# Patient Record
Sex: Male | Born: 1980 | Race: White | Hispanic: No | Marital: Single
Health system: Midwestern US, Community
[De-identification: ages and names within clinical notes are randomized; demographics above are authoritative.]

## PROBLEM LIST (undated history)

## (undated) DIAGNOSIS — J45909 Unspecified asthma, uncomplicated: Secondary | ICD-10-CM

## (undated) DIAGNOSIS — F4310 Post-traumatic stress disorder, unspecified: Secondary | ICD-10-CM

## (undated) DIAGNOSIS — F419 Anxiety disorder, unspecified: Secondary | ICD-10-CM

## (undated) HISTORY — PX: TONSILLECTOMY: SUR1361

## (undated) HISTORY — DX: Post-traumatic stress disorder, unspecified: F43.10

## (undated) HISTORY — PX: WISDOM TOOTH EXTRACTION: SHX21

## (undated) HISTORY — PX: COLONOSCOPY: SHX174

## (undated) MED ORDER — OMEPRAZOLE 20 MG CAP, DELAYED RELEASE
20 mg | ORAL_CAPSULE | Freq: Every day | ORAL | Status: AC
Start: ? — End: 2013-03-15

---

## 2009-07-26 ENCOUNTER — Emergency Department (HOSPITAL_COMMUNITY): Admission: EM | Admit: 2009-07-26 | Discharge: 2009-07-26 | Payer: Self-pay | Admitting: Emergency Medicine

## 2009-07-26 ENCOUNTER — Emergency Department (HOSPITAL_COMMUNITY): Admission: EM | Admit: 2009-07-26 | Discharge: 2009-07-27 | Payer: Self-pay | Admitting: Emergency Medicine

## 2010-11-08 NOTE — ED Provider Notes (Addendum)
HPI Comments: This is a 29 y.o.male who presents to the ED secondary to chest pain (5/10).  Pt reports intermittent chest and upper abdominal pain for the past 3 days that is not pleuritic and is not exacerbated by movement.  Pt reports he feels dizzy when the pain comes and reports each episode lasts for 30 minutes to one hour.  Pt states, "moving around makes it better."  Pt reports he is not currently in pain, but was upon arrival to ED.  Pt denies SOB, injury, recent surgery, does not verbalize complaints of n/v/d, urinary symptoms, syncope, fever, or any other medical complaint at this time.  Social hx:  Pt reports he quit smoking tobacco 4 months ago; Pt reports occasional alcohol use.    Note written by Leotis Shames A. Alben Spittle, Scribe, as dictated by Raynald Kemp, MD 8:15 PM          The history is provided by the patient.        No past medical history on file.     No past surgical history on file.      No family history on file.     History   Social History   ??? Marital Status: N/A     Spouse Name: N/A     Number of Children: N/A   ??? Years of Education: N/A   Occupational History   ??? Not on file.   Social History Main Topics   ??? Smoking status: Not on file   ??? Smokeless tobacco: Not on file   ??? Alcohol Use: Yes      occassional   ??? Drug Use: No   ??? Sexually Active:    Other Topics Concern   ??? Not on file   Social History Narrative   ??? No narrative on file                    ALLERGIES: Review of patient's allergies indicates no known allergies.      Review of Systems   Constitutional: Negative for fever and chills.   HENT: Negative for congestion and rhinorrhea.    Respiratory: Negative for cough, shortness of breath, wheezing and stridor.    Cardiovascular: Positive for chest pain. Negative for palpitations and leg swelling.   Gastrointestinal: Positive for abdominal pain (epigastric). Negative for nausea, vomiting, diarrhea and abdominal distention.    Genitourinary: Negative for dysuria, urgency, frequency, hematuria, flank pain, decreased urine volume and difficulty urinating.   Musculoskeletal: Negative for myalgias and arthralgias.   Skin: Negative for color change, pallor, rash and wound.   Neurological: Positive for dizziness.   All other systems reviewed and are negative.    Note written by Leotis Shames A. Alben Spittle, Scribe, as dictated by Raynald Kemp, MD 8:18 PM        Filed Vitals:    11/08/10 1917   BP: 134/78   Pulse: 80   Temp: 98 ??F (36.7 ??C)   Resp: 18   Height: 6' (1.829 m)   Weight: 180 lb (81.647 kg)   SpO2: 100%              Physical Exam   Nursing note and vitals reviewed.  Constitutional: He is oriented to person, place, and time. He appears well-developed and well-nourished. No distress.   HENT:   Head: Normocephalic and atraumatic.   Eyes: Pupils are equal, round, and reactive to light.   Neck: Neck supple. No JVD present. No tracheal deviation present.   Cardiovascular:  Normal rate, regular rhythm and normal heart sounds.  Exam reveals no gallop and no friction rub.    No murmur heard.  Pulmonary/Chest: Effort normal and breath sounds normal. No stridor. No respiratory distress. He has no wheezes. He has no rales. He exhibits no tenderness.   Abdominal: Soft. He exhibits no distension and no mass. No tenderness. He has no rebound and no guarding.   Musculoskeletal: Normal range of motion. He exhibits no edema and no tenderness.   Neurological: He is alert and oriented to person, place, and time.   Skin: Skin is warm and dry. No rash noted. He is not diaphoretic. No erythema. No pallor.   Psychiatric: He has a normal mood and affect. His behavior is normal. Judgment and thought content normal.    Note written by Leotis Shames A. Alben Spittle, Scribe, as dictated by Raynald Kemp, MD 8:19 PM        MDM    Procedures    ED EKG interpretation:   Rhythm: normal sinus rhythm; and regular . Rate (approx.): 75; Axis: normal; P wave: normal; QRS interval: prolonged; ST/T wave: normal; in  Lead: ; Other findings: . This EKG was interpreted by Raynald Kemp, MD,ED Provider. 9:33 PM

## 2010-11-08 NOTE — ED Notes (Signed)
Patient complains of chest tightness and dizziness for 3 days.

## 2010-11-08 NOTE — ED Notes (Signed)
Verbal report given to Kay,RN who will assume care of patient at this time.

## 2010-11-09 LAB — EKG, 12 LEAD, INITIAL
Atrial Rate: 76 {beats}/min
Calculated P Axis: 63 degrees
Calculated R Axis: 45 degrees
Calculated T Axis: 37 degrees
Diagnosis: NORMAL
P-R Interval: 174 ms
Q-T Interval: 392 ms
QRS Duration: 110 ms
QTC Calculation (Bezet): 441 ms
Ventricular Rate: 76 {beats}/min

## 2010-11-09 LAB — CBC WITH AUTOMATED DIFF
ABS. BASOPHILS: 0.1 10*3/uL (ref 0.0–0.1)
ABS. EOSINOPHILS: 0.2 10*3/uL (ref 0.0–0.4)
ABS. LYMPHOCYTES: 2.8 10*3/uL (ref 0.8–3.5)
ABS. MONOCYTES: 0.8 10*3/uL (ref 0.0–1.0)
ABS. NEUTROPHILS: 3 10*3/uL (ref 1.8–8.0)
BASOPHILS: 1 % (ref 0–1)
EOSINOPHILS: 3 % (ref 0–7)
HCT: 40.4 % (ref 36.6–50.3)
HGB: 13.6 g/dL (ref 12.1–17.0)
LYMPHOCYTES: 41 % (ref 12–49)
MCH: 29.5 PG (ref 26.0–34.0)
MCHC: 33.7 g/dL (ref 30.0–36.5)
MCV: 87.6 FL (ref 80.0–99.0)
MONOCYTES: 11 % (ref 5–13)
NEUTROPHILS: 44 % (ref 32–75)
PLATELET: 358 10*3/uL (ref 150–400)
RBC: 4.61 M/uL (ref 4.10–5.70)
RDW: 12.5 % (ref 11.5–14.5)
WBC: 6.8 10*3/uL (ref 4.1–11.1)

## 2010-11-09 LAB — METABOLIC PANEL, COMPREHENSIVE
A-G Ratio: 1.3 (ref 1.1–2.2)
ALT (SGPT): 38 U/L (ref 12–78)
AST (SGOT): 27 U/L (ref 15–37)
Albumin: 4.1 g/dL (ref 3.5–5.0)
Alk. phosphatase: 71 U/L (ref 50–136)
Anion gap: 10 mmol/L (ref 5–15)
BUN/Creatinine ratio: 10 — ABNORMAL LOW (ref 12–20)
BUN: 10 MG/DL (ref 6–20)
Bilirubin, total: 0.3 MG/DL (ref 0.2–1.0)
CO2: 27 MMOL/L (ref 21–32)
Calcium: 8.8 MG/DL (ref 8.5–10.1)
Chloride: 106 MMOL/L (ref 97–108)
Creatinine: 1 MG/DL (ref 0.6–1.3)
GFR est AA: >60 mL/min/{1.73_m2} (ref >60)
GFR est non-AA: >60 mL/min/{1.73_m2} (ref >60)
Globulin: 3.1 g/dL (ref 2.0–4.0)
Glucose: 85 MG/DL (ref 65–100)
Potassium: 3.5 MMOL/L (ref 3.5–5.1)
Protein, total: 7.2 g/dL (ref 6.4–8.2)
Sodium: 143 MMOL/L (ref 136–145)

## 2010-11-09 LAB — TROPONIN I: Troponin-I, Qt.: <0.04 ng/mL (ref <0.05)

## 2010-11-09 MED ORDER — RANITIDINE 150 MG TAB
150 mg | ORAL_TABLET | Freq: Two times a day (BID) | ORAL | Status: AC
Start: 2010-11-09 — End: 2010-11-18

## 2011-03-18 LAB — URINALYSIS, ROUTINE W REFLEX MICROSCOPIC
Glucose, UA: NEGATIVE mg/dL
Glucose, UA: NEGATIVE mg/dL
Ketones, ur: NEGATIVE mg/dL
Nitrite: NEGATIVE
Protein, ur: NEGATIVE mg/dL
Urobilinogen, UA: 0.2 mg/dL (ref 0.0–1.0)
pH: 7 (ref 5.0–8.0)

## 2011-03-18 LAB — BASIC METABOLIC PANEL
BUN: 5 mg/dL — ABNORMAL LOW (ref 6–23)
Calcium: 9.2 mg/dL (ref 8.4–10.5)
Creatinine, Ser: 0.89 mg/dL (ref 0.4–1.5)
GFR calc Af Amer: >60 mL/min (ref >60)
GFR calc non Af Amer: >60 mL/min (ref >60)

## 2011-03-18 LAB — CBC
MCHC: 33.4 g/dL (ref 30.0–36.0)
MCV: 93.1 fL (ref 78.0–100.0)
RBC: 4.96 MIL/uL (ref 4.22–5.81)
RDW: 13.7 % (ref 11.5–15.5)

## 2011-03-18 LAB — GLUCOSE, CAPILLARY: Glucose-Capillary: 90 mg/dL (ref 70–99)

## 2011-03-18 LAB — COMPREHENSIVE METABOLIC PANEL
AST: 22 U/L (ref 0–37)
CO2: 30 mEq/L (ref 19–32)
Calcium: 9 mg/dL (ref 8.4–10.5)
Creatinine, Ser: 0.96 mg/dL (ref 0.4–1.5)
GFR calc Af Amer: >60 mL/min (ref >60)
GFR calc non Af Amer: >60 mL/min (ref >60)
Total Protein: 6.2 g/dL (ref 6.0–8.3)

## 2011-03-18 LAB — RAPID URINE DRUG SCREEN, HOSP PERFORMED
Barbiturates: NOT DETECTED
Benzodiazepines: NOT DETECTED
Cocaine: NOT DETECTED

## 2011-03-18 LAB — DIFFERENTIAL
Eosinophils Relative: 2 % (ref 0–5)
Lymphocytes Relative: 23 % (ref 12–46)
Lymphs Abs: 2.4 10*3/uL (ref 0.7–4.0)
Monocytes Relative: 10 % (ref 3–12)
Neutrophils Relative %: 65 % (ref 43–77)

## 2011-03-18 LAB — D-DIMER, QUANTITATIVE: D-Dimer, Quant: <0.22 ug{FEU}/mL (ref 0.00–0.48)

## 2012-02-27 NOTE — ED Provider Notes (Signed)
Patient is a 31 y.o. male presenting with chest pain and palpitations. The history is provided by the patient.   Chest Pain   This is a new problem. The current episode started yesterday. The problem has been resolved. The pain is associated with normal activity. The pain is present in the substernal region. The patient is experiencing no pain. The quality of the pain is described as pressure-like. The pain does not radiate. Associated symptoms include palpitations. Pertinent negatives include no abdominal pain, no cough, no diaphoresis, no fever, no malaise/fatigue, no nausea, no shortness of breath and no vomiting. He has tried nothing for the symptoms. Risk factors include smoking/tobacco exposure.   Palpitations   Associated symptoms include chest pain. Pertinent negatives include no diaphoresis, no fever, no malaise/fatigue, no abdominal pain, no nausea, no vomiting, no cough and no shortness of breath.    31 yo tobacco user who presents with intermittent episodes of chest tightness. No pain currently. Denies SOB.     No past medical history on file.     No past surgical history on file.      No family history on file.     History     Social History   ??? Marital Status: SINGLE     Spouse Name: N/A     Number of Children: N/A   ??? Years of Education: N/A     Occupational History   ??? Not on file.     Social History Main Topics   ??? Smoking status: Not on file   ??? Smokeless tobacco: Not on file   ??? Alcohol Use: Yes      occassional   ??? Drug Use: No   ??? Sexually Active:      Other Topics Concern   ??? Not on file     Social History Narrative   ??? No narrative on file                  ALLERGIES: Review of patient's allergies indicates no known allergies.      Review of Systems   Constitutional: Negative for fever, malaise/fatigue and diaphoresis.   Respiratory: Negative for cough and shortness of breath.    Cardiovascular: Positive for chest pain and palpitations.   Gastrointestinal: Negative for nausea, vomiting,  abdominal pain and diarrhea.   All other systems reviewed and are negative.        Filed Vitals:    02/27/12 1914   BP: 127/82   Pulse: 88   Temp: 98.3 ??F (36.8 ??C)   Resp: 20   Height: 6' (1.829 m)   Weight: 81.647 kg (180 lb)   SpO2: 100%            Physical Exam   Nursing note and vitals reviewed.  Constitutional: He is oriented to person, place, and time. He appears well-developed and well-nourished. No distress.   HENT:   Head: Normocephalic and atraumatic.   Right Ear: External ear normal.   Left Ear: External ear normal.   Eyes: Conjunctivae and EOM are normal. Pupils are equal, round, and reactive to light. Right eye exhibits no discharge. Left eye exhibits no discharge. No scleral icterus.   Neck: Normal range of motion. Neck supple. No tracheal deviation present.   Cardiovascular: Normal rate, regular rhythm, normal heart sounds and intact distal pulses.    No murmur heard.  Pulmonary/Chest: Effort normal and breath sounds normal. No respiratory distress. He has no wheezes. He has no rales.  Abdominal: He exhibits no distension.   Musculoskeletal: Normal range of motion. He exhibits no edema and no tenderness.   Neurological: He is alert and oriented to person, place, and time. No cranial nerve deficit. Coordination normal.   Skin: Skin is warm. No rash noted. He is not diaphoretic. No erythema.   Psychiatric: He has a normal mood and affect. His behavior is normal. Judgment and thought content normal.        MDM    Procedures    ecg - NSR, rate 74, no ST elev or dep

## 2012-02-27 NOTE — ED Notes (Signed)
Pt told of results will treat for reflux and have pt f/u

## 2012-02-27 NOTE — ED Notes (Signed)
Chst pain and tightness intermittently throughout day.  Pain described as "squeezing" and "pressure".  No associated SOB.

## 2012-02-27 NOTE — ED Notes (Signed)
I have reviewed discharge instructions with the patient.  The patient verbalized understanding.

## 2012-02-28 LAB — CBC WITH AUTOMATED DIFF
ABS. BASOPHILS: 0 10*3/uL (ref 0.0–0.1)
ABS. EOSINOPHILS: 0.1 10*3/uL (ref 0.0–0.4)
ABS. LYMPHOCYTES: 3 10*3/uL (ref 0.8–3.5)
ABS. MONOCYTES: 0.6 10*3/uL (ref 0.0–1.0)
ABS. NEUTROPHILS: 3.1 10*3/uL (ref 1.8–8.0)
BASOPHILS: 1 % (ref 0–1)
EOSINOPHILS: 2 % (ref 0–7)
HCT: 41.9 % (ref 36.6–50.3)
HGB: 14.4 g/dL (ref 12.1–17.0)
LYMPHOCYTES: 44 % (ref 12–49)
MCH: 30.4 PG (ref 26.0–34.0)
MCHC: 34.4 g/dL (ref 30.0–36.5)
MCV: 88.6 FL (ref 80.0–99.0)
MONOCYTES: 9 % (ref 5–13)
NEUTROPHILS: 44 % (ref 32–75)
PLATELET: 315 10*3/uL (ref 150–400)
RBC: 4.73 M/uL (ref 4.10–5.70)
RDW: 12.4 % (ref 11.5–14.5)
WBC: 6.8 10*3/uL (ref 4.1–11.1)

## 2012-02-28 LAB — POC CHEM8
Anion gap (POC): 15 mmol/L (ref 5–15)
BUN (POC): 12 MG/DL (ref 9–20)
CO2 (POC): 25 MMOL/L (ref 21–32)
Calcium, ionized (POC): 1.14 MMOL/L (ref 1.12–1.32)
Chloride (POC): 103 MMOL/L (ref 98–107)
Creatinine (POC): 0.8 MG/DL (ref 0.6–1.3)
GFRAA, POC: >60 mL/min/{1.73_m2} (ref >60)
GFRNA, POC: >60 mL/min/{1.73_m2} (ref >60)
Glucose (POC): 114 MG/DL — ABNORMAL HIGH (ref 75–110)
Hematocrit (POC): 43 % (ref 36.6–50.3)
Hemoglobin (POC): 14.6 GM/DL (ref 12.1–17.0)
Potassium (POC): 3.2 MMOL/L — ABNORMAL LOW (ref 3.5–5.1)
Sodium (POC): 139 MMOL/L (ref 136–145)

## 2012-02-28 LAB — POC TROPONIN-I
Troponin-I (POC): <0.04 ng/mL (ref 0.00–0.08)
Troponin-I (POC): <0.04 ng/mL (ref 0.00–0.08)

## 2012-02-28 MED ORDER — OMEPRAZOLE 40 MG CAP, DELAYED RELEASE
40 mg | ORAL_CAPSULE | Freq: Every day | ORAL | Status: DC
Start: 2012-02-28 — End: 2014-07-02

## 2012-02-29 LAB — EKG, 12 LEAD, INITIAL
Atrial Rate: 74 {beats}/min
Calculated P Axis: 49 degrees
Calculated R Axis: 24 degrees
Calculated T Axis: 32 degrees
Diagnosis: NORMAL
P-R Interval: 190 ms
Q-T Interval: 374 ms
QRS Duration: 108 ms
QTC Calculation (Bezet): 415 ms
Ventricular Rate: 74 {beats}/min

## 2013-02-13 LAB — POC CHEM8
Anion gap (POC): 15 mmol/L (ref 5–15)
BUN (POC): 15 MG/DL (ref 9–20)
CO2 (POC): 27 MMOL/L (ref 21–32)
Calcium, ionized (POC): 1.12 MMOL/L (ref 1.12–1.32)
Chloride (POC): 100 MMOL/L (ref 98–107)
Creatinine (POC): 1 MG/DL (ref 0.6–1.3)
GFRAA, POC: >60 mL/min/{1.73_m2} (ref >60)
GFRNA, POC: >60 mL/min/{1.73_m2} (ref >60)
Glucose (POC): 95 MG/DL (ref 75–110)
Hematocrit (POC): 43 % (ref 36.6–50.3)
Hemoglobin (POC): 14.6 GM/DL (ref 12.1–17.0)
Potassium (POC): 3.4 MMOL/L — ABNORMAL LOW (ref 3.5–5.1)
Sodium (POC): 137 MMOL/L (ref 136–145)

## 2013-02-13 LAB — POC TROPONIN-I: Troponin-I (POC): <0.04 ng/mL (ref 0.00–0.08)

## 2013-02-13 MED ADMIN — pantoprazole (PROTONIX) injection 40 mg: INTRAVENOUS | @ 18:00:00 | NDC 00008400101

## 2013-02-13 MED ADMIN — mylanta/donnatal/viscous lidocaine (GI COCKTAIL): ORAL | @ 18:00:00 | NDC 99990238820

## 2013-02-13 NOTE — ED Notes (Signed)
Pt arrived via EMS for CP x 1 week. Pt states that the chest pain started when he began Prednisone for poison oak last week. Denies cardiac Hx. Pt denies any CP or complaints on arrival to this facility. Pt states that CP resolved en route.

## 2013-02-13 NOTE — ED Provider Notes (Signed)
Patient is a 32 y.o. male presenting with chest pain. The history is provided by the patient.   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 The current episode started more than 1 week ago. The problem has been gradually worsening. The problem occurs daily. Associated with: lying lfat. The pain is present in the substernal region. The pain is moderate. The quality of the pain is described as burning and pressure-like. The pain does not radiate. The symptoms are aggravated by certain positions. Pertinent negatives include no abdominal pain, no back pain, no claudication, no cough, no diaphoresis, no dizziness, no exertional chest pressure, no fever, no headaches, no hemoptysis, no irregular heartbeat, no leg pain, no lower extremity edema, no malaise/fatigue, no nausea, no near-syncope, no numbness, no orthopnea, no palpitations, no PND, no shortness of breath, no sputum production, no vomiting and no weakness. He has tried antacids for the symptoms. The treatment provided mild relief. Risk factors include smoking/tobacco exposure. His past medical history does not include aneurysm, cancer, DM, DVT, HTN, PE or CHF. Pertinent negatives include no exercise treadmill test.       No past medical history on file.     Past Surgical History   Procedure Laterality Date   ??? Hx heent       tonsilectomy         No family history on file.     History     Social History   ??? Marital Status: SINGLE     Spouse Name: N/A     Number of Children: N/A   ??? Years of Education: N/A     Occupational History   ??? Not on file.     Social History Main Topics   ??? Smoking status: Current Every Day Smoker -- 0.50 packs/day   ??? Smokeless tobacco: Not on file   ??? Alcohol Use: Yes      Comment: occassional   ??? Drug Use: No   ??? Sexually Active: Not on file      Other Topics Concern   ??? Not on file     Social History Narrative   ??? No narrative on file                  ALLERGIES: Review of patient's allergies indicates no known allergies.      Review of Systems   Constitutional: Negative for fever, malaise/fatigue and diaphoresis.   HENT: Positive for sore throat.    Respiratory: Negative for cough, hemoptysis, sputum production and shortness of breath.    Cardiovascular: Positive for chest pain. Negative for palpitations, orthopnea, claudication, PND and near-syncope.   Gastrointestinal: Negative for nausea, vomiting, abdominal pain, diarrhea, constipation and blood in stool.   Musculoskeletal: Negative for back pain and arthralgias.   Neurological: Negative for dizziness, syncope, weakness, light-headedness, numbness and headaches.   Hematological: Negative.        Filed Vitals:    02/13/13 1239   BP: 138/89   Pulse: 74   Temp: 97.7 ??F (36.5 ??C)   Resp: 18   Height: 6' (1.829 m)   Weight: 77.111 kg (170 lb)   SpO2: 99%            Physical Exam   Nursing note and vitals reviewed.  Constitutional: He is oriented  to person, place, and time. He appears well-developed and well-nourished. No distress.   HENT:   Head: Normocephalic and atraumatic.   Right Ear: External ear normal.   Left Ear: External ear normal.   Mouth/Throat: Oropharynx is clear and moist. No oropharyngeal exudate.   Eyes: Conjunctivae and EOM are normal. Pupils are equal, round, and reactive to light. Right eye exhibits no discharge. Left eye exhibits no discharge. No scleral icterus.   Neck: Normal range of motion. Neck supple. No tracheal deviation present. No thyromegaly present.   Cardiovascular: Normal rate, regular rhythm, normal heart sounds and intact distal pulses.    No murmur heard.  Pulmonary/Chest: Effort normal and breath sounds normal. No respiratory distress. He has no wheezes. He has no rales. He exhibits no tenderness.   Abdominal: Soft. Bowel sounds are normal. He exhibits no distension.  There is no tenderness. There is no rebound and no guarding.   Musculoskeletal: Normal range of motion. He exhibits no edema and no tenderness.   Lymphadenopathy:     He has no cervical adenopathy.   Neurological: He is alert and oriented to person, place, and time. No cranial nerve deficit. Coordination normal.   Skin: Skin is warm. No rash noted. He is not diaphoretic. No erythema. No pallor.   Psychiatric: He has a normal mood and affect. His behavior is normal. Judgment and thought content normal.        MDM    Procedures    ECG:  Rate 67, sinus rhythm, no ectopy, normal intervals and axis, no STTW changes, normal ecg

## 2013-02-13 NOTE — ED Notes (Signed)
Pain occurs at noc, while supine, with acid taste, worse after starting prednisone

## 2013-02-13 NOTE — ED Notes (Signed)
I have reviewed discharge instructions with the patient.  The patient verbalized understanding. Pt ambulatory independently to waiting area. Coworker driving patient home. Armband removed. Denies complaints.

## 2013-02-14 LAB — EKG, 12 LEAD, INITIAL
Atrial Rate: 67 {beats}/min
Calculated P Axis: 42 degrees
Calculated R Axis: 18 degrees
Calculated T Axis: 16 degrees
Diagnosis: NORMAL
P-R Interval: 176 ms
Q-T Interval: 378 ms
QRS Duration: 110 ms
QTC Calculation (Bezet): 399 ms
Ventricular Rate: 67 {beats}/min

## 2014-07-02 MED ORDER — PREDNISONE 10 MG TABLETS IN A DOSE PACK
10 mg | ORAL_TABLET | ORAL | Status: DC
Start: 2014-07-02 — End: 2017-03-05

## 2014-07-02 MED ORDER — TRAMADOL 50 MG TAB
50 mg | ORAL_TABLET | Freq: Four times a day (QID) | ORAL | Status: DC | PRN
Start: 2014-07-02 — End: 2017-03-05

## 2014-07-02 MED ADMIN — naproxen (NAPROSYN) tablet 500 mg: ORAL | @ 15:00:00 | NDC 68462018801

## 2014-07-02 MED FILL — NAPROXEN 250 MG TAB: 250 mg | ORAL | Qty: 2

## 2014-07-02 NOTE — ED Notes (Signed)
Discharged by provider.

## 2014-07-02 NOTE — ED Provider Notes (Signed)
HPI Comments: Zara CouncilRichard Paparella is a 33 y.o. male who presents ambulatory to the ED with a c/o intermittent left arm pain x 4 days, worse today 7/10 aching. Pt denies injury. He reports awakening with the pain 4 days ago, 3 days ago and it bothered him, yesterday it did not bother him and today it was much worse. He denies tx pta. He is right hand dominant. The pain is worse with movement and palpation. He denies tingling/numbness or chest pain  He specifically denies any fevers, chills, nausea, vomiting, chest pain, shortness of breath, headache, rash, diarrhea, sweating or weight loss.  Marland Kitchen.    PCP: [facility]  PMHx significant for: History reviewed. No pertinent past medical history.  PSHx significant for: Past Surgical History:    HX HEENT                                                         Comment:tonsilectomy  Social Hx: Tobacco: denies current EtOH: social Illicit drug use: denies    There are no further complaints or symptoms at this time.            History reviewed. No pertinent past medical history.     Past Surgical History   Procedure Laterality Date   ??? Hx heent       tonsilectomy         History reviewed. No pertinent family history.     History     Social History   ??? Marital Status: SINGLE     Spouse Name: N/A     Number of Children: N/A   ??? Years of Education: N/A     Occupational History   ??? Not on file.     Social History Main Topics   ??? Smoking status: Current Every Day Smoker -- 0.50 packs/day   ??? Smokeless tobacco: Not on file   ??? Alcohol Use: Yes      Comment: occassional   ??? Drug Use: No   ??? Sexual Activity: Not on file     Other Topics Concern   ??? Not on file     Social History Narrative                  ALLERGIES: Review of patient's allergies indicates no known allergies.      Review of Systems   Constitutional: Negative for fever and chills.   HENT: Negative for congestion, rhinorrhea, sneezing and sore throat.    Eyes: Negative for redness and visual disturbance.    Respiratory: Negative for shortness of breath.    Cardiovascular: Negative for chest pain and leg swelling.   Gastrointestinal: Negative for nausea, vomiting and abdominal pain.   Genitourinary: Negative for frequency and difficulty urinating.   Musculoskeletal: Positive for myalgias and arthralgias. Negative for back pain and neck stiffness.   Skin: Negative for rash.   Neurological: Negative for dizziness, syncope, weakness and headaches.   Hematological: Negative for adenopathy.       Filed Vitals:    07/02/14 1025   BP: 127/78   Pulse: 81   Temp: 98.3 ??F (36.8 ??C)   Resp: 12   Height: 6' (1.829 m)   Weight: 79.379 kg (175 lb)   SpO2: 94%            Physical Exam  Constitutional: He is oriented to person, place, and time. He appears well-developed and well-nourished. No distress.   HENT:   Head: Normocephalic and atraumatic.   Right Ear: External ear normal.   Left Ear: External ear normal.   Nose: Nose normal.   Mouth/Throat: Oropharynx is clear and moist.   Eyes: EOM are normal. Pupils are equal, round, and reactive to light.   Neck: Normal range of motion. Neck supple.   Cardiovascular: Normal rate, regular rhythm, normal heart sounds and intact distal pulses.  Exam reveals no gallop and no friction rub.    No murmur heard.  Pulmonary/Chest: Effort normal and breath sounds normal. No stridor. No respiratory distress. He has no wheezes. He has no rales. He exhibits no tenderness.   Musculoskeletal: Normal range of motion. He exhibits tenderness. He exhibits no edema.   Left medial anterior arm TTP from proximal forearm to distal humeral areas without swelling, no discoloration, no lesions. Normothermic. Cap refill brisk. ROM intact with increased discomfort with full ROM. Distal n/v intact. Grips 5/5 bilaterally   Neurological: He is alert and oriented to person, place, and time. He exhibits normal muscle tone. Coordination normal.   Skin: Skin is warm and dry. He is not diaphoretic. No pallor.    Psychiatric: He has a normal mood and affect. His behavior is normal.   Nursing note and vitals reviewed.       MDM  Number of Diagnoses or Management Options     Amount and/or Complexity of Data Reviewed  Tests in the radiology section of CPT??: ordered and reviewed  Review and summarize past medical records: yes  Independent visualization of images, tracings, or specimens: yes    Patient Progress  Patient progress: improved      Procedures    Chief Complaint   Patient presents with   ??? Arm Pain       11:29 AM  The patients presenting problems have been discussed, and they are in agreement with the care plan formulated and outlined with them.  I have encouraged them to ask questions as they arise throughout their visit.      IMAGING COMPLETED AND REVIEWED:  The following have been ordered and reviewed:  XR ELBOW LT MIN 3 V   Final Result   **Final Report**          ICD Codes / Adm.Diagnosis: 160049   / Arm Pain  arm hurts (left)   Examination:  CR ELBOW MIN 3 VWS LT  - [id number] - Jul 02 2014 11:04AM   Accession No:  [id number]   Reason:  elbow pain         REPORT:   EXAM:  CR ELBOW MIN 3 VWS LT      INDICATION:   elbow pain      COMPARISON: None.      FINDINGS: Three views of the left elbow demonstrate no fracture,    dislocation, effusion or other acute abnormality.                IMPRESSION: No acute abnormality.                        Signing/Reading Doctor: Elsie Saas [id number])     ApprovedEphriam Knuckles [employer]IELD [phone])  Jul 02 2014 11:09AM  __________________________________________________________________________________________________________________________________    MEDICATIONS GIVEN:  Medications   naproxen (NAPROSYN) tablet 500 mg (500 mg Oral Given 07/02/14 1114)        CLINICAL IMPRESSION:  1. Arm pain, medial, left          Plan  1.sterapred, ultracet rx  2.f/u pcp  Return to ED if sx's worsen    DISCHARGE NOTE:  11:29 AM   Freddie Breech. Yohana Bartha, PA-C spoke with Zara Council and family about sx, dx, tx, and rx with good understanding. Care plan outlined and precautions discussed. Pt has been re-examined. Pt is feeling better. There are no new complaints, changes, or physical findings. Reviewed results of  radiologywith pt and family.  Medications given in ED and prescription medications were discussed with pt and family. All pt and family???s questions and concerns were addressed.  Family was instructed and agrees to have pt f/u with PCP, as well as to have pt return to ED upon further deterioration. Pt is ready to go home.

## 2014-07-02 NOTE — ED Notes (Addendum)
Pt states intermittent left arm pain since Monday. States a pinching pain in the mid forearm up towards his axillary area. No meds taken for pain. Denies any injury.

## 2014-07-02 NOTE — ED Notes (Signed)
Ice applied.

## 2017-03-05 ENCOUNTER — Inpatient Hospital Stay
Admit: 2017-03-05 | Discharge: 2017-03-05 | Disposition: A | Discharge status: Home / Self Care | Payer: BLUE CROSS/BLUE SHIELD | Attending: Emergency Medicine

## 2017-03-05 ENCOUNTER — Emergency Department: Admit: 2017-03-05 | Payer: BLUE CROSS/BLUE SHIELD | Primary: Family Medicine

## 2017-03-05 DIAGNOSIS — R079 Chest pain, unspecified: Secondary | ICD-10-CM

## 2017-03-05 LAB — METABOLIC PANEL, COMPREHENSIVE
A-G Ratio: 1.1 (ref 1.1–2.2)
ALT (SGPT): 57 U/L (ref 12–78)
AST (SGOT): 33 U/L (ref 15–37)
Albumin: 3.9 g/dL (ref 3.5–5.0)
Alk. phosphatase: 62 U/L (ref 45–117)
Anion gap: 10 mmol/L (ref 5–15)
BUN/Creatinine ratio: 10 — ABNORMAL LOW (ref 12–20)
BUN: 10 MG/DL (ref 6–20)
Bilirubin, total: 0.2 MG/DL (ref 0.2–1.0)
CO2: 28 mmol/L (ref 21–32)
Calcium: 8.9 MG/DL (ref 8.5–10.1)
Chloride: 103 mmol/L (ref 97–108)
Creatinine: 0.99 MG/DL (ref 0.70–1.30)
GFR est AA: >60 mL/min/{1.73_m2} (ref >60)
GFR est non-AA: >60 mL/min/{1.73_m2} (ref >60)
Globulin: 3.5 g/dL (ref 2.0–4.0)
Glucose: 115 mg/dL — ABNORMAL HIGH (ref 65–100)
Potassium: 4.1 mmol/L (ref 3.5–5.1)
Protein, total: 7.4 g/dL (ref 6.4–8.2)
Sodium: 141 mmol/L (ref 136–145)

## 2017-03-05 LAB — CBC WITH AUTOMATED DIFF
ABS. BASOPHILS: 0 10*3/uL (ref 0.0–0.1)
ABS. EOSINOPHILS: 0.1 10*3/uL (ref 0.0–0.4)
ABS. IMM. GRANS.: 0 10*3/uL (ref 0.00–0.04)
ABS. LYMPHOCYTES: 1.9 10*3/uL (ref 0.8–3.5)
ABS. MONOCYTES: 0.5 10*3/uL (ref 0.0–1.0)
ABS. NEUTROPHILS: 3.1 10*3/uL (ref 1.8–8.0)
ABSOLUTE NRBC: 0 10*3/uL (ref 0.00–0.01)
BASOPHILS: 1 % (ref 0–1)
EOSINOPHILS: 1 % (ref 0–7)
HCT: 41.5 % (ref 36.6–50.3)
HGB: 13.8 g/dL (ref 12.1–17.0)
IMMATURE GRANULOCYTES: 0 % (ref 0.0–0.5)
LYMPHOCYTES: 34 % (ref 12–49)
MCH: 30 PG (ref 26.0–34.0)
MCHC: 33.3 g/dL (ref 30.0–36.5)
MCV: 90.2 FL (ref 80.0–99.0)
MONOCYTES: 10 % (ref 5–13)
MPV: 10.2 FL (ref 8.9–12.9)
NEUTROPHILS: 54 % (ref 32–75)
NRBC: 0 PER 100 WBC
PLATELET: 332 10*3/uL (ref 150–400)
RBC: 4.6 M/uL (ref 4.10–5.70)
RDW: 12.1 % (ref 11.5–14.5)
WBC: 5.6 10*3/uL (ref 4.1–11.1)

## 2017-03-05 LAB — TROPONIN I: Troponin-I, Qt.: <0.04 ng/mL (ref <0.05)

## 2017-03-05 MED ORDER — DICLOFENAC 50 MG TAB, DELAYED RELEASE
50 mg | ORAL_TABLET | Freq: Two times a day (BID) | ORAL | 0 refills | Status: AC
Start: 2017-03-05 — End: ?

## 2017-03-05 NOTE — ED Triage Notes (Signed)
Patient was speaking with his boss and developed chest pain and jaw pain, states he feels weird, lightheaded as well.

## 2017-03-05 NOTE — ED Provider Notes (Signed)
HPI Comments: 36 y.o. male with no significant past medical history who presents from home with chief complaint of chest pain.  Patient states onset of moderate localized lower left chest pain described as a "knuckle to his chest" within the last hour. Patient denies any known aggravating actors including coughing, deep breaths, movement, and upon palpation. Patient also complains of moderate bilateral jaw pain since waking up this morning at 0500 approximately 6 hours ago. Patient notes his jaw pain is aggravated upon palpation with accompanying light-headedness and dizziness. He also complains of a brief episode of nausea this morning that has since subsided. Patient denies any hx of the present sx. He mentions history of high cholesterol, but he denies hypertension, diabetes, and heart problems. Patient denies shortness of breath, vomiting, leg pain, and leg swelling. There are no other acute medical concerns at this time.    Old Chart Review: Per note, patient has been evaluated a few times in the ED for sx of chest pain associated with GERD with the last evaluation in 2014.     Social hx: Tobacco Use: Yes (1/2 pack per day), Alcohol Use: Yes (occasionally), Drug Use: No    Note written by Janeece RiggersJustin H. Nguyen, Scribe, as dictated by Cindi CarbonGary Annie Saephan, MD 11:04 AM      The history is provided by the patient and medical records.        History reviewed. No pertinent past medical history.    Past Surgical History:   Procedure Laterality Date   ??? HX HEENT      tonsilectomy         History reviewed. No pertinent family history.    Social History     Social History   ??? Marital status: SINGLE     Spouse name: N/A   ??? Number of children: N/A   ??? Years of education: N/A     Occupational History   ??? Not on file.     Social History Main Topics   ??? Smoking status: Current Every Day Smoker     Packs/day: 0.50   ??? Smokeless tobacco: Not on file   ??? Alcohol use Yes      Comment: occassional   ??? Drug use: No    ??? Sexual activity: Not on file     Other Topics Concern   ??? Not on file     Social History Narrative         ALLERGIES: Review of patient's allergies indicates no known allergies.    Review of Systems   Constitutional: Negative for fever.   HENT:        Bilateral jaw pain   Eyes: Negative for visual disturbance.   Respiratory: Negative for cough, shortness of breath and wheezing.    Cardiovascular: Positive for chest pain. Negative for leg swelling.   Gastrointestinal: Positive for nausea. Negative for abdominal pain, diarrhea and vomiting.   Genitourinary: Negative for dysuria.   Musculoskeletal: Negative.  Negative for back pain and neck stiffness.   Skin: Negative for rash.   Neurological: Positive for dizziness and light-headedness. Negative for syncope and headaches.   Psychiatric/Behavioral: Negative for confusion.   All other systems reviewed and are negative.      Vitals:    03/05/17 1045   BP: 139/83   Pulse: 80   Resp: 14   Temp: 97.8 ??F (36.6 ??C)   SpO2: 97%   Weight: 88.5 kg (195 lb)   Height: 6' (1.829 m)  Physical Exam   Constitutional: He appears well-developed and well-nourished. No distress.   HENT:   Head: Normocephalic.   Eyes: Pupils are equal, round, and reactive to light.   Neck: Normal range of motion.   Cardiovascular: Normal rate and regular rhythm.    No murmur heard.  Pulmonary/Chest: Effort normal and breath sounds normal. No respiratory distress.   No chest tenderness upon palpation   Abdominal: Soft. There is no tenderness.   Musculoskeletal: Normal range of motion. He exhibits no edema.   Neurological: He is alert. He has normal strength. No cranial nerve deficit.   Skin: Skin is warm and dry.   Psychiatric: He has a normal mood and affect. His behavior is normal.   Nursing note and vitals reviewed.     Note written by Janeece Riggers, Scribe, as dictated by Cindi Carbon, MD 11:04 AM    MDM      ED Course       Procedures    ED EKG interpretation:   Rhythm: normal sinus rhythm; and regular . Rate (approx.): 78; Axis: normal; ST/T wave: normal; No acute ST changes. No EKG changes from 02/23/13.   Note written by Janeece Riggers, Scribe, as dictated by Cindi Carbon, MD 12:00 PM       Discussed test results, clinical impression,  and need for followup in 1-2 days if not improving.  Patients questions were answered. Discharge instructions given to patient.

## 2017-03-05 NOTE — ED Triage Notes (Signed)
Based on CC, patient taken directly to quiet for rapid EKG.

## 2017-03-05 NOTE — ED Notes (Signed)
Patient discharged by MD. pt given the opportunity to ask questions, verbalized understanding of teaching.

## 2017-03-06 LAB — EKG, 12 LEAD, INITIAL
Atrial Rate: 78 {beats}/min
Calculated P Axis: 38 degrees
Calculated R Axis: 5 degrees
Calculated T Axis: 14 degrees
Diagnosis: NORMAL
P-R Interval: 174 ms
Q-T Interval: 384 ms
QRS Duration: 104 ms
QTC Calculation (Bezet): 437 ms
Ventricular Rate: 78 {beats}/min

## 2017-03-06 LAB — EKG 12-LEAD
Atrial Rate: 78 {beats}/min
Diagnosis: NORMAL
P Axis: 38 degrees
P-R Interval: 174 ms
Q-T Interval: 384 ms
QRS Duration: 104 ms
QTc Calculation (Bazett): 437 ms
R Axis: 5 degrees
T Axis: 14 degrees
Ventricular Rate: 78 {beats}/min

## 2017-10-31 ENCOUNTER — Inpatient Hospital Stay
Admit: 2017-10-31 | Discharge: 2017-10-31 | Disposition: A | Discharge status: Home / Self Care | Payer: BLUE CROSS/BLUE SHIELD | Attending: Student in an Organized Health Care Education/Training Program

## 2017-10-31 DIAGNOSIS — G44209 Tension-type headache, unspecified, not intractable: Secondary | ICD-10-CM

## 2017-10-31 MED ORDER — METOCLOPRAMIDE 5 MG/ML IJ SOLN
5 mg/mL | INTRAMUSCULAR | Status: AC
Start: 2017-10-31 — End: 2017-10-31
  Administered 2017-10-31: 19:00:00 via INTRAMUSCULAR

## 2017-10-31 MED ORDER — DIPHENHYDRAMINE 25 MG CAP
25 mg | ORAL | Status: AC
Start: 2017-10-31 — End: 2017-10-31
  Administered 2017-10-31: 19:00:00 via ORAL

## 2017-10-31 MED ORDER — BUTALBITAL-ACETAMINOPHEN-CAFFEINE 50 MG-325 MG-40 MG TAB
50-325-40 mg | ORAL | Status: AC
Start: 2017-10-31 — End: 2017-10-31
  Administered 2017-10-31: 19:00:00 via ORAL

## 2017-10-31 MED ORDER — BUTALBITAL-ACETAMINOPHEN-CAFFEINE 50 MG-325 MG-40 MG TAB
50-325-40 mg | ORAL_TABLET | ORAL | 0 refills | Status: AC | PRN
Start: 2017-10-31 — End: ?

## 2017-10-31 MED FILL — DIPHENHYDRAMINE 25 MG CAP: 25 mg | ORAL | Qty: 1

## 2017-10-31 MED FILL — METOCLOPRAMIDE 5 MG/ML IJ SOLN: 5 mg/mL | INTRAMUSCULAR | Qty: 2

## 2017-10-31 MED FILL — BUTALBITAL-ACETAMINOPHEN-CAFFEINE 50 MG-325 MG-40 MG TAB: 50-325-40 mg | ORAL | Qty: 2

## 2017-10-31 NOTE — ED Triage Notes (Addendum)
Headache and weakness x 2 days, denies fevers. Denies CP

## 2017-10-31 NOTE — ED Provider Notes (Addendum)
1:59 PM  I have evaluated the patient as the Provider in Triage. I have reviewed His vital signs and the triage nurse assessment. I have talked with the patient and any available family and advised that I am the provider in triage and have ordered the appropriate study to initiate their work up based on the clinical presentation during my assessment.  I have advised that the patient will be accommodated in the Main ED as soon as possible.  I have also requested to contact the triage nurse or myself immediately if the patient experiences any changes in their condition during this brief waiting period.  Arelia SneddonGregory N Saquoia Sianez, MD        The history is provided by the patient.   Headache    This is a recurrent problem. The current episode started more than 1 week ago (about a month now). Episode frequency: daily. The problem has not changed since onset.The headache is aggravated by bright light (lots of stress recently; before a month ago didn't get headache, now almost on a daily basis usually at the end of the day. No precipating factors that patient can identify. No recent trauma. No fever or neck pain.  ). The pain is located in the bilateral and occipital region. The quality of the pain is described as dull and throbbing. The pain is mild. Associated symptoms include malaise/fatigue and weakness (generalized). Pertinent negatives include no fever, no chest pressure, no palpitations, no shortness of breath, no visual change and no vomiting. He has tried NSAIDs (last took two hours ago) for the symptoms. The treatment provided mild relief.        No past medical history on file.    Past Surgical History:   Procedure Laterality Date   ??? HX HEENT      tonsilectomy         No family history on file.    Social History     Socioeconomic History   ??? Marital status: SINGLE     Spouse name: Not on file   ??? Number of children: Not on file   ??? Years of education: Not on file   ??? Highest education level: Not on file   Social Needs    ??? Financial resource strain: Not on file   ??? Food insecurity - worry: Not on file   ??? Food insecurity - inability: Not on file   ??? Transportation needs - medical: Not on file   ??? Transportation needs - non-medical: Not on file   Occupational History   ??? Not on file   Tobacco Use   ??? Smoking status: Current Every Day Smoker     Packs/day: 0.50   Substance and Sexual Activity   ??? Alcohol use: Yes     Comment: occassional   ??? Drug use: No   ??? Sexual activity: Not on file   Other Topics Concern   ??? Not on file   Social History Narrative   ??? Not on file         ALLERGIES: Patient has no known allergies.    Review of Systems   Constitutional: Positive for fatigue and malaise/fatigue. Negative for chills and fever.   HENT: Negative for sore throat.    Eyes: Negative for pain.   Respiratory: Negative for cough and shortness of breath.    Cardiovascular: Negative for chest pain and palpitations.   Gastrointestinal: Negative for abdominal pain and vomiting.   Genitourinary: Negative for dysuria.   Skin: Negative  for rash.   Neurological: Positive for weakness (generalized) and headaches. Negative for syncope.   Psychiatric/Behavioral: Negative for confusion.   All other systems reviewed and are negative.      Vitals:    10/31/17 1354   BP: 121/78   Pulse: 93   Resp: 14   Temp: 97.7 ??F (36.5 ??C)   SpO2: 98%   Weight: 88.5 kg (195 lb)   Height: 6' (1.829 m)            Physical Exam   Constitutional: He is oriented to person, place, and time. He appears well-developed. No distress.   HENT:   Head: Normocephalic and atraumatic.   Eyes: Conjunctivae and EOM are normal.   Neck: Normal range of motion. Neck supple. Muscular tenderness (with spams in paracervical muscles) present. No neck rigidity. Normal range of motion present.   Cardiovascular: Normal rate, regular rhythm and normal heart sounds.   Pulmonary/Chest: Effort normal and breath sounds normal. No respiratory distress.    Abdominal: Soft. There is no tenderness. There is no guarding.   Musculoskeletal: Normal range of motion. He exhibits no edema.   Neurological: He is alert and oriented to person, place, and time. He has normal strength. No cranial nerve deficit or sensory deficit. He exhibits normal muscle tone. Gait normal.   Skin: Skin is warm and dry.   Vitals reviewed.       MDM       Procedures    3:06 PM  The patient presented to the emergency department with a headache. The patient is now resting comfortably and feels better, is alert, talkative, interactive and in no distress. The patient appears well and is able to tolerate PO fluids. The repeat examination is unremarkable and benign. The patient is neurologically intact, has a normal mental status, and is ambulatory in the ED. The history, exam, diagnostic testing (if any) and the patient's current condition do not suggest meningitis, stroke, sepsis, subarachnoid hemorrhage, intracranial bleeding, encephalitis, temporal arteritis or other significant pathology to warrant further testing, continued ED treatment, admission, neurological consultation, or other specialist evaluation at this point. The vital signs have been stable. The patient's condition is stable and appropriate for discharge. The patient will pursue further outpatient evaluation with the primary care physician or other designated or consulting physician as indicated in the discharge instructions.

## 2021-02-03 ENCOUNTER — Other Ambulatory Visit: Payer: Self-pay

## 2021-02-03 ENCOUNTER — Ambulatory Visit (INDEPENDENT_AMBULATORY_CARE_PROVIDER_SITE_OTHER): Payer: Managed Care, Other (non HMO)

## 2021-02-03 ENCOUNTER — Encounter: Payer: Self-pay | Admitting: Emergency Medicine

## 2021-02-03 ENCOUNTER — Ambulatory Visit
Admission: EM | Admit: 2021-02-03 | Discharge: 2021-02-03 | Disposition: A | Discharge status: Home / Self Care | Payer: Managed Care, Other (non HMO) | Attending: Internal Medicine | Admitting: Internal Medicine

## 2021-02-03 DIAGNOSIS — S62522A Displaced fracture of distal phalanx of left thumb, initial encounter for closed fracture: Secondary | ICD-10-CM

## 2021-02-03 DIAGNOSIS — M79642 Pain in left hand: Secondary | ICD-10-CM

## 2021-02-03 MED ORDER — HYDROCODONE-ACETAMINOPHEN 5-325 MG PO TABS: 1.0000 | ORAL_TABLET | Freq: Four times a day (QID) | ORAL | 0 refills | Status: DC | PRN
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
## 2021-02-10 ENCOUNTER — Other Ambulatory Visit (HOSPITAL_COMMUNITY)
Admission: RE | Admit: 2021-02-10 | Discharge: 2021-02-10 | Disposition: A | Discharge status: Home / Self Care | Payer: Managed Care, Other (non HMO) | Source: Ambulatory Visit | Attending: Orthopedic Surgery | Admitting: Orthopedic Surgery

## 2021-02-10 DIAGNOSIS — Z01812 Encounter for preprocedural laboratory examination: Secondary | ICD-10-CM | POA: Insufficient documentation

## 2021-02-10 DIAGNOSIS — Z20822 Contact with and (suspected) exposure to covid-19: Secondary | ICD-10-CM | POA: Insufficient documentation

## 2021-02-10 LAB — SARS CORONAVIRUS 2 (TAT 6-24 HRS): SARS Coronavirus 2: NEGATIVE

## 2021-02-10 NOTE — Progress Notes (Signed)
Patient presented to the drive-thru COVID testing center today for pre-procedural testing. Patient states he had a positive COVID test in December 2021 at another facility but was unable to provide documentation of the positive test. No positive test was documented in his medical record. COVID test performed per standing orders.

## 2021-02-11 ENCOUNTER — Encounter (HOSPITAL_COMMUNITY): Payer: Self-pay | Admitting: Orthopedic Surgery

## 2021-02-11 NOTE — Progress Notes (Signed)
Robert Harris denies chest pain or shortness of breath. Patient  Tested negative for Covid 02/10/21 and has been in quarantine since that time.  I instructed Robert Harris to stop Ibuprofen and Vitamins.

## 2021-02-12 ENCOUNTER — Other Ambulatory Visit: Payer: Self-pay

## 2021-02-12 ENCOUNTER — Ambulatory Visit (HOSPITAL_COMMUNITY)
Admission: RE | Admit: 2021-02-12 | Discharge: 2021-02-12 | Disposition: A | Discharge status: Home / Self Care | Payer: Managed Care, Other (non HMO) | Attending: Orthopedic Surgery | Admitting: Orthopedic Surgery

## 2021-02-12 ENCOUNTER — Encounter (HOSPITAL_COMMUNITY): Payer: Self-pay | Admitting: Orthopedic Surgery

## 2021-02-12 ENCOUNTER — Ambulatory Visit (HOSPITAL_COMMUNITY): Payer: Managed Care, Other (non HMO)

## 2021-02-12 ENCOUNTER — Ambulatory Visit (HOSPITAL_COMMUNITY): Payer: Managed Care, Other (non HMO) | Admitting: Anesthesiology

## 2021-02-12 ENCOUNTER — Encounter (HOSPITAL_COMMUNITY)
Admission: RE | Disposition: A | Discharge status: Home / Self Care | Payer: Self-pay | Source: Home / Self Care | Attending: Orthopedic Surgery

## 2021-02-12 DIAGNOSIS — F17210 Nicotine dependence, cigarettes, uncomplicated: Secondary | ICD-10-CM | POA: Insufficient documentation

## 2021-02-12 DIAGNOSIS — Z791 Long term (current) use of non-steroidal anti-inflammatories (NSAID): Secondary | ICD-10-CM | POA: Diagnosis not present

## 2021-02-12 DIAGNOSIS — S62212A Bennett's fracture, left hand, initial encounter for closed fracture: Secondary | ICD-10-CM | POA: Insufficient documentation

## 2021-02-12 DIAGNOSIS — Z79899 Other long term (current) drug therapy: Secondary | ICD-10-CM | POA: Insufficient documentation

## 2021-02-12 DIAGNOSIS — X58XXXA Exposure to other specified factors, initial encounter: Secondary | ICD-10-CM | POA: Diagnosis not present

## 2021-02-12 HISTORY — DX: Anxiety disorder, unspecified: F41.9

## 2021-02-12 HISTORY — PX: CLOSED REDUCTION METACARPAL WITH PERCUTANEOUS PINNING: SHX5613

## 2021-02-12 SURGERY — CLOSED REDUCTION, FRACTURE, METACARPAL BONE, WITH PERCUTANEOUS PINNING
Anesthesia: Regional | Site: Thumb | Laterality: Left

## 2021-02-12 MED ORDER — MIDAZOLAM HCL 2 MG/2ML IJ SOLN
INTRAMUSCULAR | Status: AC
  Filled 2021-02-12: qty 2

## 2021-02-12 MED ORDER — PROPOFOL 10 MG/ML IV BOLUS
INTRAVENOUS | Status: DC | PRN
  Administered 2021-02-12: 20 mg via INTRAVENOUS

## 2021-02-12 MED ORDER — LACTATED RINGERS IV SOLN: INTRAVENOUS | Status: DC

## 2021-02-12 MED ORDER — PROPOFOL 500 MG/50ML IV EMUL
INTRAVENOUS | Status: DC | PRN
  Administered 2021-02-12: 100 ug/kg/min via INTRAVENOUS

## 2021-02-12 MED ORDER — ORAL CARE MOUTH RINSE: 15.0000 mL | Freq: Once | OROMUCOSAL | Status: AC

## 2021-02-12 MED ORDER — ONDANSETRON HCL 4 MG/2ML IJ SOLN
INTRAMUSCULAR | Status: DC | PRN
  Administered 2021-02-12: 4 mg via INTRAVENOUS

## 2021-02-12 MED ORDER — CEFAZOLIN SODIUM-DEXTROSE 2-3 GM-%(50ML) IV SOLR
INTRAVENOUS | Status: DC | PRN
  Administered 2021-02-12: 2 g via INTRAVENOUS

## 2021-02-12 MED ORDER — CHLORHEXIDINE GLUCONATE 0.12 % MT SOLN
15.0000 mL | Freq: Once | OROMUCOSAL | Status: AC
  Administered 2021-02-12: 15 mL via OROMUCOSAL
  Filled 2021-02-12: qty 15

## 2021-02-12 MED ORDER — DEXAMETHASONE SODIUM PHOSPHATE 10 MG/ML IJ SOLN
INTRAMUSCULAR | Status: DC | PRN
  Administered 2021-02-12: 4 mg via INTRAVENOUS

## 2021-02-12 MED ORDER — 0.9 % SODIUM CHLORIDE (POUR BTL) OPTIME
TOPICAL | Status: DC | PRN
  Administered 2021-02-12: 1000 mL

## 2021-02-12 MED ORDER — ROPIVACAINE HCL 5 MG/ML IJ SOLN
INTRAMUSCULAR | Status: DC | PRN
  Administered 2021-02-12: 30 mL via PERINEURAL

## 2021-02-12 MED ORDER — FENTANYL CITRATE (PF) 250 MCG/5ML IJ SOLN
INTRAMUSCULAR | Status: AC
  Filled 2021-02-12: qty 5

## 2021-02-12 MED ORDER — FENTANYL CITRATE (PF) 250 MCG/5ML IJ SOLN
INTRAMUSCULAR | Status: DC | PRN
  Administered 2021-02-12: 25 ug via INTRAVENOUS
  Administered 2021-02-12: 50 ug via INTRAVENOUS
  Administered 2021-02-12: 25 ug via INTRAVENOUS

## 2021-02-12 MED ORDER — PROPOFOL 10 MG/ML IV BOLUS
INTRAVENOUS | Status: AC
  Filled 2021-02-12: qty 40

## 2021-02-12 MED ORDER — OXYCODONE-ACETAMINOPHEN 5-325 MG PO TABS: 1.0000 | ORAL_TABLET | Freq: Four times a day (QID) | ORAL | 0 refills | Status: AC

## 2021-02-12 MED ORDER — MIDAZOLAM HCL 5 MG/5ML IJ SOLN
INTRAMUSCULAR | Status: DC | PRN
  Administered 2021-02-12: 2 mg via INTRAVENOUS

## 2021-02-12 SURGICAL SUPPLY — 51 items
BENZOIN TINCTURE PRP APPL 2/3 (GAUZE/BANDAGES/DRESSINGS) IMPLANT
BIT DRILL 2 FAST STEP (BIT) ×2 IMPLANT
BLADE CLIPPER SURG (BLADE) IMPLANT
BNDG ELASTIC 3X5.8 VLCR STR LF (GAUZE/BANDAGES/DRESSINGS) ×2 IMPLANT
BNDG ELASTIC 4X5.8 VLCR STR LF (GAUZE/BANDAGES/DRESSINGS) ×2 IMPLANT
BNDG ESMARK 4X9 LF (GAUZE/BANDAGES/DRESSINGS) ×2 IMPLANT
BNDG GAUZE ELAST 4 BULKY (GAUZE/BANDAGES/DRESSINGS) ×2 IMPLANT
CORD BIPOLAR FORCEPS 12FT (ELECTRODE) ×2 IMPLANT
COVER SURGICAL LIGHT HANDLE (MISCELLANEOUS) ×2 IMPLANT
COVER WAND RF STERILE (DRAPES) ×2 IMPLANT
CUFF TOURN SGL QUICK 18X4 (TOURNIQUET CUFF) ×2 IMPLANT
CUFF TOURN SGL QUICK 24 (TOURNIQUET CUFF)
CUFF TRNQT CYL 24X4X16.5-23 (TOURNIQUET CUFF) IMPLANT
DRAPE OEC MINIVIEW 54X84 (DRAPES) ×2 IMPLANT
DRSG EMULSION OIL 3X3 NADH (GAUZE/BANDAGES/DRESSINGS) ×2 IMPLANT
GAUZE SPONGE 4X4 12PLY STRL (GAUZE/BANDAGES/DRESSINGS) IMPLANT
GAUZE SPONGE 4X4 12PLY STRL LF (GAUZE/BANDAGES/DRESSINGS) ×2 IMPLANT
GAUZE XEROFORM 1X8 LF (GAUZE/BANDAGES/DRESSINGS) IMPLANT
GLOVE BIOGEL PI IND STRL 8.5 (GLOVE) ×1 IMPLANT
GLOVE BIOGEL PI INDICATOR 8.5 (GLOVE) ×1
GLOVE SURG ORTHO 8.0 STRL STRW (GLOVE) ×2 IMPLANT
GOWN STRL REUS W/ TWL LRG LVL3 (GOWN DISPOSABLE) ×2 IMPLANT
GOWN STRL REUS W/TWL LRG LVL3 (GOWN DISPOSABLE) ×2
KIT BASIN OR (CUSTOM PROCEDURE TRAY) ×2 IMPLANT
KIT TURNOVER KIT B (KITS) ×2 IMPLANT
NS IRRIG 1000ML POUR BTL (IV SOLUTION) ×2 IMPLANT
PACK ORTHO EXTREMITY (CUSTOM PROCEDURE TRAY) ×2 IMPLANT
PAD ARMBOARD 7.5X6 YLW CONV (MISCELLANEOUS) ×4 IMPLANT
PAD CAST 3X4 CTTN HI CHSV (CAST SUPPLIES) ×1 IMPLANT
PAD CAST 4YDX4 CTTN HI CHSV (CAST SUPPLIES) ×1 IMPLANT
PADDING CAST COTTON 3X4 STRL (CAST SUPPLIES) ×1
PADDING CAST COTTON 4X4 STRL (CAST SUPPLIES) ×1
SCREW PEG 2.5X16 NONLOCK (Screw) ×4 IMPLANT
SCREW PEG 2.5X18 NONLOCK (Screw) ×2 IMPLANT
SOAP 2 % CHG 4 OZ (WOUND CARE) ×2 IMPLANT
SPLINT FIBERGLASS 3X12 (CAST SUPPLIES) ×2 IMPLANT
STRIP CLOSURE SKIN 1/2X4 (GAUZE/BANDAGES/DRESSINGS) IMPLANT
SUCTION FRAZIER TIP 8 FR DISP (SUCTIONS) ×1
SUCTION TUBE FRAZIER 8FR DISP (SUCTIONS) ×1 IMPLANT
SUT ETHILON 4 0 P 3 18 (SUTURE) IMPLANT
SUT ETHILON 5 0 P 3 18 (SUTURE)
SUT MNCRL AB 4-0 PS2 18 (SUTURE) ×2 IMPLANT
SUT NYLON ETHILON 5-0 P-3 1X18 (SUTURE) IMPLANT
SUT PROLENE 4 0 P 3 18 (SUTURE) IMPLANT
SUT PROLENE 4 0 PS 2 18 (SUTURE) ×2 IMPLANT
SUT VIC AB 2-0 FS1 27 (SUTURE) ×2 IMPLANT
SUT VIC AB 4-0 PS2 18 (SUTURE) ×2 IMPLANT
TOWEL GREEN STERILE (TOWEL DISPOSABLE) ×2 IMPLANT
TOWEL GREEN STERILE FF (TOWEL DISPOSABLE) ×2 IMPLANT
TUBE CONNECTING 12X1/4 (SUCTIONS) ×2 IMPLANT
WATER STERILE IRR 1000ML POUR (IV SOLUTION) ×2 IMPLANT
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
## 2021-02-12 NOTE — Anesthesia Procedure Notes (Signed)
Anesthesia Regional Block: Supraclavicular block   Pre-Anesthetic Checklist: ,, timeout performed, Correct Patient, Correct Site, Correct Laterality, Correct Procedure, Correct Position, site marked, Risks and benefits discussed,  Surgical consent,  Pre-op evaluation,  At surgeon's request and post-op pain management  Laterality: Left  Prep: chloraprep       Needles:  Injection technique: Single-shot  Needle Type: Echogenic Stimulator Needle     Needle Length: 9cm  Needle Gauge: 21     Additional Needles:   Procedures:,,,, ultrasound used (permanent image in chart),,,,  Narrative:  Start time: 02/12/2021 7:45 AM End time: 02/12/2021 7:50 AM Injection made incrementally with aspirations every 5 mL.  Performed by: Personally  Anesthesiologist: Leonides Grills, MD  Additional Notes: Functioning IV was confirmed and monitors were applied.  A timeout was performed. Sterile prep, hand hygiene and sterile gloves were used. A 67mm 21ga Arrow echogenic stimulator needle was used. Negative aspiration and negative test dose prior to incremental administration of local anesthetic. The patient tolerated the procedure well.  Ultrasound guidance: relevent anatomy identified, needle position confirmed, local anesthetic spread visualized around nerve(s), vascular puncture avoided.  Image printed for medical record.

## 2021-02-12 NOTE — Discharge Instructions (Signed)
KEEP BANDAGE CLEAN AND DRY °CALL OFFICE FOR F/U APPT [phone] °KEEP HAND ELEVATED ABOVE HEART °OK TO APPLY ICE TO OPERATIVE AREA °CONTACT OFFICE IF ANY WORSENING PAIN OR CONCERNS. °

## 2021-02-12 NOTE — Anesthesia Preprocedure Evaluation (Addendum)
Anesthesia Evaluation  Patient identified by MRN, date of birth, ID band Patient awake    Reviewed: Allergy & Precautions, NPO status , Patient's Chart, lab work & pertinent test results  Airway Mallampati: II  TM Distance: >3 FB Neck ROM: Full    Dental  (+) Poor Dentition, Chipped, Missing   Pulmonary Current Smoker and Patient abstained from smoking.,    Pulmonary exam normal breath sounds clear to auscultation       Cardiovascular negative cardio ROS Normal cardiovascular exam Rhythm:Regular Rate:Normal     Neuro/Psych Anxiety negative neurological ROS     GI/Hepatic negative GI ROS, (+)     substance abuse  ,   Endo/Other  negative endocrine ROS  Renal/GU negative Renal ROS     Musculoskeletal negative musculoskeletal ROS (+)   Abdominal   Peds  Hematology negative hematology ROS (+)   Anesthesia Other Findings Left thumb metacarpal displaced fracture  Reproductive/Obstetrics                            Anesthesia Physical Anesthesia Plan  ASA: II  Anesthesia Plan: Regional and MAC   Post-op Pain Management:  Regional for Post-op pain   Induction: Intravenous  PONV Risk Score and Plan: 0 and Ondansetron, Dexamethasone, Propofol infusion, Midazolam and Treatment may vary due to age or medical condition  Airway Management Planned: Simple Face Mask  Additional Equipment:   Intra-op Plan:   Post-operative Plan:   Informed Consent: I have reviewed the patients History and Physical, chart, labs and discussed the procedure including the risks, benefits and alternatives for the proposed anesthesia with the patient or authorized representative who has indicated his/her understanding and acceptance.     Dental advisory given  Plan Discussed with: CRNA  Anesthesia Plan Comments:       Anesthesia Quick Evaluation

## 2021-02-12 NOTE — Anesthesia Procedure Notes (Signed)
Procedure Name: MAC Date/Time: 02/12/2021 8:00 AM Performed by: Wilburn Cornelia, CRNA Pre-anesthesia Checklist: Patient identified, Emergency Drugs available, Suction available, Patient being monitored and Timeout performed Patient Re-evaluated:Patient Re-evaluated prior to induction Oxygen Delivery Method: Simple face mask Placement Confirmation: positive ETCO2 and breath sounds checked- equal and bilateral Dental Injury: Teeth and Oropharynx as per pre-operative assessment

## 2021-02-12 NOTE — Op Note (Signed)
PREOPERATIVE DIAGNOSIS: Left thumb Bennett's fracture dislocation carpometacarpal joint  POSTOPERATIVE DIAGNOSIS: Same  ATTENDING SURGEON: Dr. Bradly Bienenstock who scrubbed and present for the entire procedure  ASSISTANT SURGEON: None  ANESTHESIA: Regional with IV sedation  OPERATIVE PROCEDURE: Open treatment of left thumb Bennett's fracture dislocation of the [facility] joint with internal fixation Radiographs 3 views left thumb  IMPLANTS: Three 2.5 millimeter screws from the [employer] handset  EBL: Minimal  RADIOGRAPHIC INTERPRETATION: AP lateral oblique views of the thumb do show the screw fixation in place with good alignment of the [facility] joint and the Bennett fracture  SURGICAL INDICATIONS: Patient is a right-hand-dominant gentleman who sustained a closed injury to his left thumb.  Patient seen evaluate in the office and recommended undergo the above procedure.  Risks of surgery include but not limited to bleeding infection damage nearby nerves arteries or tendons nonunion malunion hardware failure loss of motion of the wrist and digits incomplete relief of symptoms and need for further surgical invention.  SURGICAL TECHNIQUE: Patient palpated find the preoperative holding area marked apart a marker made on the left thumb indicate correct operative site.  Patient brought back operating placed supine on anesthesia table where the regional anesthetic is administered.  Preoperative antibiotics were given for any skin incision.  A well-padded tourniquet placed on the left brachium and seal with appropriate drape.  Left upper extremities and prepped and draped normal sterile fashion.  A timeout was called the correct site identified procedure then begun.  A Waggoner incision was then made to the thumb [facility] joint.  Dissection carried out the skin and subcutaneous tissue after the tourniquet insufflated.  The thenar musculature was then incised along the fascial layer and then carefully elevated off the thumb  metacarpal.  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  Takedown of the nascent malunion was then carried out and open reduction was then performed.  Reduction was able to be performed well reducing the fracture in good position.  Following this three 2.5 millimeter screws were then placed from a dorsal to volar direction capturing the large Bennett fragment.  Final radiographs were then obtained.  The wound was then thoroughly irrigated.  The capsule was then closed with 2-0 Vicryl the subcutaneous tissues and the thenar fascia was then closed with 2-0 Vicryl suture.  Subcutaneous tissues closed with Monocryl.  Skin was then closed using simple Prolene sutures.  Adaptic dressing sterile compressive bandage then applied.  The patient was placed in a well-padded thumb spica splint taken recovery room in good condition.  POSTOPERATIVE PLAN: Patient be discharged to home.  See him back in the office in 2 weeks for wound check suture removal x-rays application of a short arm thumb spica cast for total of 4 weeks.  At the 4-week mark we will begin a therapy regimen.  Radiographs at each visit.  Placed the therapy order the first postoperative visit for splint and eval at the 4-week mark.

## 2021-02-12 NOTE — Transfer of Care (Signed)
Immediate Anesthesia Transfer of Care Note  Patient: Robert Harris  Procedure(s) Performed: open reduction and internal fixation of metacarpal left thumb (Left Thumb)  Patient Location: PACU  Anesthesia Type:MAC combined with regional for post-op pain  Level of Consciousness: awake and alert   Airway & Oxygen Therapy: Patient Spontanous Breathing and Patient connected to face mask oxygen  Post-op Assessment: Report given to RN and Post -op Vital signs reviewed and stable  Post vital signs: Reviewed and stable  Last Vitals:  Vitals Value Taken Time  BP    Temp    Pulse 81 02/12/21 0944  Resp 13 02/12/21 0944  SpO2 100 % 02/12/21 0944  Vitals shown include unvalidated device data.  Last Pain:  Vitals:   02/12/21 0654  TempSrc: Oral  PainSc: 3       Patients Stated Pain Goal: 3 (78/97/84 7841)  Complications: No complications documented.

## 2021-02-12 NOTE — Anesthesia Postprocedure Evaluation (Signed)
Anesthesia Post Note  Patient: Robert Harris  Procedure(s) Performed: open reduction and internal fixation of metacarpal left thumb (Left Thumb)     Patient location during evaluation: PACU Anesthesia Type: Regional and MAC Level of consciousness: awake and alert Pain management: pain level controlled Vital Signs Assessment: post-procedure vital signs reviewed and stable Respiratory status: spontaneous breathing, nonlabored ventilation, respiratory function stable and patient connected to nasal cannula oxygen Cardiovascular status: stable and blood pressure returned to baseline Postop Assessment: no apparent nausea or vomiting Anesthetic complications: no   No complications documented.  Last Vitals:  Vitals:   02/12/21 1000 02/12/21 1015  BP: 107/76 118/82  Pulse: 78 83  Resp: 12 15  Temp:  36.6 C  SpO2: 98% 96%    Last Pain:  Vitals:   02/12/21 1015  TempSrc:   PainSc: 0-No pain                 Tawona Filsinger P Phung Kotas

## 2021-02-14 ENCOUNTER — Encounter (HOSPITAL_COMMUNITY): Payer: Self-pay | Admitting: Orthopedic Surgery
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
## 2022-04-05 ENCOUNTER — Ambulatory Visit
Admission: EM | Admit: 2022-04-05 | Discharge: 2022-04-05 | Disposition: A | Discharge status: Home / Self Care | Payer: Managed Care, Other (non HMO) | Attending: Family Medicine | Admitting: Family Medicine

## 2022-04-05 ENCOUNTER — Ambulatory Visit (INDEPENDENT_AMBULATORY_CARE_PROVIDER_SITE_OTHER): Payer: Self-pay

## 2022-04-05 ENCOUNTER — Encounter: Payer: Self-pay | Admitting: Emergency Medicine

## 2022-04-05 DIAGNOSIS — S62524A Nondisplaced fracture of distal phalanx of right thumb, initial encounter for closed fracture: Secondary | ICD-10-CM | POA: Diagnosis not present

## 2022-04-05 DIAGNOSIS — M79644 Pain in right finger(s): Secondary | ICD-10-CM | POA: Diagnosis not present

## 2022-04-05 DIAGNOSIS — S6701XA Crushing injury of right thumb, initial encounter: Secondary | ICD-10-CM

## 2022-04-05 DIAGNOSIS — S6991XA Unspecified injury of right wrist, hand and finger(s), initial encounter: Secondary | ICD-10-CM

## 2022-04-05 NOTE — ED Triage Notes (Signed)
Slammed right thumb in car door last week.  States thumb still hurts and is still swollen ?

## 2022-04-05 NOTE — ED Provider Notes (Signed)
?[facility] ? ? ? ?CSN: [id number] ?Arrival date & time: 04/05/22  1734 ? ? ?  ? ?History   ?Chief Complaint ?No chief complaint on file. ? ? ?HPI ?Robert Harris is a 41 y.o. male.  ? ?Presenting today with significant right thumb pain, swelling after slamming the tip of his thumb into a car door last week.  He denies numbness, tingling, complete loss of range of motion, nail damage.  Has been trying over-the-counter pain relievers with minimal relief. ? ? ?Past Medical History:  ?Diagnosis Date  ? Anxiety   ? ? ?There are no problems to display for this patient. ? ? ?Past Surgical History:  ?Procedure Laterality Date  ? CLOSED REDUCTION METACARPAL WITH PERCUTANEOUS PINNING Left 02/12/2021  ? Procedure: open reduction and internal fixation of metacarpal left thumb;  Surgeon: Iran Planas, MD;  Location: [place];  Service: Orthopedics;  Laterality: Left;  with IV sedation  ? COLONOSCOPY    ? TONSILLECTOMY    ? WISDOM TOOTH EXTRACTION    ? ? ? ? ? ?Home Medications   ? ?Prior to Admission medications   ?Medication Sig Start Date End Date Taking? Authorizing Provider  ?Calcium-Magnesium-Zinc (CAL-MAG-ZINC PO) Take 1 capsule by mouth daily at 2 am.    [provider]  ?Multiple Vitamins-Minerals (MULTIVITAMIN WITH MINERALS) tablet Take 1 tablet by mouth daily. Men    [provider]  ? ? ?Family History ?History reviewed. No pertinent family history. ? ?Social History ?Social History  ? ?Tobacco Use  ? Smoking status: Every Day  ?  Packs/day: 1.00  ?  Years: 20.00  ?  Pack years: 20.00  ?  Types: Cigarettes  ? Smokeless tobacco: Current  ?  Types: Chew  ? Tobacco comments:  ?  "every now and then"  ?Vaping Use  ? Vaping Use: Never used  ?Substance Use Topics  ? Alcohol use: Yes  ?  Alcohol/week: 18.0 standard drinks  ?  Types: 18 Cans of beer per week  ? Drug use: Never  ? ? ? ?Allergies   ?Patient has no known allergies. ? ? ?Review of Systems ?Review of Systems ?Per HPI ? ?Physical  Exam ?Triage Vital Signs ?ED Triage Vitals  ?Enc Vitals Group  ?   BP 04/05/22 1805 110/72  ?   Pulse Rate 04/05/22 1805 (!) 111  ?   Resp 04/05/22 1805 18  ?   Temp 04/05/22 1805 98.7 ?F (37.1 ?C)  ?   Temp Source 04/05/22 1805 Oral  ?   SpO2 04/05/22 1805 97 %  ?   Weight --   ?   Height --   ?   Head Circumference --   ?   Peak Flow --   ?   Pain Score 04/05/22 1806 1  ?   Pain Loc --   ?   Pain Edu? --   ?   Excl. in [place]? --   ? ?No data found. ? ?Updated Vital Signs ?BP 110/72 (BP Location: Right Arm)   Pulse (!) 111   Temp 98.7 ?F (37.1 ?C) (Oral)   Resp 18   SpO2 97%  ? ?Visual Acuity ?Right Eye Distance:   ?Left Eye Distance:   ?Bilateral Distance:   ? ?Right Eye Near:   ?Left Eye Near:    ?Bilateral Near:    ? ?Physical Exam ?Vitals and nursing note reviewed.  ?Constitutional:   ?   Appearance: Normal appearance.  ?HENT:  ?  Head: Atraumatic.  ?Eyes:  ?   Extraocular Movements: Extraocular movements intact.  ?   Conjunctiva/sclera: Conjunctivae normal.  ?Cardiovascular:  ?   Rate and Rhythm: Normal rate and regular rhythm.  ?Pulmonary:  ?   Effort: Pulmonary effort is normal.  ?   Breath sounds: Normal breath sounds.  ?Musculoskeletal:     ?   General: Swelling, tenderness and signs of injury present. No deformity. Normal range of motion.  ?   Cervical back: Normal range of motion and neck supple.  ?   Comments: Significant tenderness to palpation over distal right thumb, no obvious bony deformity palpable.  Range of motion appears intact.  Diffusely edematous in this area  ?Skin: ?   General: Skin is warm and dry.  ?   Findings: Bruising present.  ?   Comments: Distal right thumb erythematous, edematous, slightly bruised.  Nail intact  ?Neurological:  ?   General: No focal deficit present.  ?   Mental Status: He is oriented to person, place, and time.  ?   Comments: Right hand neurovascularly intact  ?Psychiatric:     ?   Mood and Affect: Mood normal.     ?   Thought Content: Thought content normal.      ?   Judgment: Judgment normal.  ? ? ? ?UC Treatments / Results  ?Labs ?(all labs ordered are listed, but only abnormal results are displayed) ?Labs Reviewed - No data to display ? ?EKG ? ? ?Radiology ?DG Finger Thumb Right ? ?Result Date: 04/05/2022 ?CLINICAL DATA:  Slammed thumb in car door last week. Pain and swelling. EXAM: RIGHT THUMB 2+V COMPARISON:  None FINDINGS:: FINDINGS: Mild thumb carpometacarpal joint space narrowing and peripheral osteophytosis. Minimal dorsal spurring at the base of the thumb proximal phalanx. There is curvilinear lucency within the distal tuft of the distal phalanx of the thumb, multiple nondisplaced linear fracture lines. No dislocation. IMPRESSION: Multiple linear lucencies within the distal tuft of the distal phalanx of the thumb indicating nondisplaced acute to subacute fractures. Electronically Signed   By: Yvonne Kendall M.D.   On: 04/05/2022 18:31   ? ?Procedures ?Procedures (including critical care time) ? ?Medications Ordered in UC ?Medications - No data to display ? ?Initial Impression / Assessment and Plan / UC Course  ?I have reviewed the triage vital signs and the nursing notes. ? ?Pertinent labs & imaging results that were available during my care of the patient were reviewed by me and considered in my medical decision making (see chart for details). ? ?  ? ? ?X-ray showing distal right thumb fracture, nondisplaced.  Will place in a finger splint, discussed RICE protocol, over-the-counter pain relievers.  Light duty work note given.  Follow-up with orthopedics for recheck. ? ?Final Clinical Impressions(s) / UC Diagnoses  ? ?Final diagnoses:  ?Thumb pain, right  ?Injury of right thumb, initial encounter  ?Closed nondisplaced fracture of distal phalanx of right thumb, initial encounter  ? ?Discharge Instructions   ?None ?  ? ?ED Prescriptions   ?None ?  ? ?PDMP not reviewed this encounter. ?  ?Volney American, PA-C ?04/05/22 1910 ? ?

## 2022-04-10 ENCOUNTER — Encounter: Payer: Self-pay | Admitting: Orthopedic Surgery

## 2022-04-10 ENCOUNTER — Ambulatory Visit (INDEPENDENT_AMBULATORY_CARE_PROVIDER_SITE_OTHER): Payer: 59 | Admitting: Orthopedic Surgery

## 2022-04-10 VITALS — BP 142/89 | HR 99 | Ht 72.0 in | Wt 165.8 lb

## 2022-04-10 DIAGNOSIS — S62524A Nondisplaced fracture of distal phalanx of right thumb, initial encounter for closed fracture: Secondary | ICD-10-CM | POA: Diagnosis not present

## 2022-04-10 MED ORDER — IBUPROFEN 800 MG PO TABS: 800.0000 mg | ORAL_TABLET | Freq: Three times a day (TID) | ORAL | 1 refills | Status: DC | PRN

## 2022-04-10 MED ORDER — CEPHALEXIN 500 MG PO CAPS: 500.0000 mg | ORAL_CAPSULE | Freq: Two times a day (BID) | ORAL | 0 refills | Status: DC

## 2022-04-10 MED ORDER — HYDROCODONE-ACETAMINOPHEN 5-325 MG PO TABS
1.0000 | ORAL_TABLET | Freq: Four times a day (QID) | ORAL | 0 refills | Status: DC | PRN
Start: 2022-04-10 — End: 2023-11-07

## 2022-04-10 NOTE — Progress Notes (Signed)
NEW PROBLEM//OFFICE VISIT ? ? ?Chief Complaint  ?Patient presents with  ? Hand Injury  ?  Rt thumb ?DOI 04/06/22 caught in car door, now has an odor coming from area  ? ?HPI ?41 year old male seen at urgent care on the 27th his right dominant thumb was caught in the car door he smashed it and fractured the tip of the phalanx ? ?Over the weekend it started to blister he cut it he now has a wound on the dorsum of the finger, dermis has now been removed when he removed the dressing in the office he said there was some drainage ? ?ROS: Denies numbness or tingling ?ROS ? ? ?BP (!) 142/89   Pulse 99   Ht 6' (1.829 m)   Wt 165 lb 12.8 oz (75.2 kg)   BMI 22.49 kg/m?  ? ?Body mass index is 22.49 kg/m?. ? ?General appearance: Well-developed well-nourished no gross deformities ? ?Cardiovascular normal pulse and perfusion normal color without edema ? ?Neurologically no sensation loss or deficits or pathologic reflexes ? ?Psychological: Awake alert and oriented x3 mood and affect normal ? ?Skin no lacerations or ulcerations no nodularity no palpable masses, no erythema or nodularity ? ?Musculoskeletal:  ? ?See media pictures.  He has a wound on the dorsum of the proximal phalanx he has blood under the nail tenderness swelling of the right thumb ? ? ? ? ? ?Past Medical History:  ?Diagnosis Date  ? Anxiety   ? ? ?Past Surgical History:  ?Procedure Laterality Date  ? CLOSED REDUCTION METACARPAL WITH PERCUTANEOUS PINNING Left 02/12/2021  ? Procedure: open reduction and internal fixation of metacarpal left thumb;  Surgeon: Bradly Bienenstock, MD;  Location: [facility];  Service: Orthopedics;  Laterality: Left;  with IV sedation  ? COLONOSCOPY    ? TONSILLECTOMY    ? WISDOM TOOTH EXTRACTION    ? ? ?No family history on file. ?Social History  ? ?Tobacco Use  ? Smoking status: Every Day  ?  Packs/day: 1.00  ?  Years: 20.00  ?  Pack years: 20.00  ?  Types: Cigarettes  ? Smokeless tobacco: Current  ?  Types: Chew  ? Tobacco comments:  ?  "every  now and then"  ?Vaping Use  ? Vaping Use: Never used  ?Substance Use Topics  ? Alcohol use: Yes  ?  Alcohol/week: 18.0 standard drinks  ?  Types: 18 Cans of beer per week  ? Drug use: Never  ? ? ?No Known Allergies ? ?Current Meds  ?Medication Sig  ? Calcium-Magnesium-Zinc (CAL-MAG-ZINC PO) Take 1 capsule by mouth daily at 2 am.  ? cephALEXin (KEFLEX) 500 MG capsule Take 1 capsule (500 mg total) by mouth 2 (two) times daily.  ? HYDROcodone-acetaminophen (NORCO/VICODIN) 5-325 MG tablet Take 1 tablet by mouth every 6 (six) hours as needed for moderate pain.  ? ibuprofen (ADVIL) 800 MG tablet Take 1 tablet (800 mg total) by mouth every 8 (eight) hours as needed.  ? Multiple Vitamins-Minerals (MULTIVITAMIN WITH MINERALS) tablet Take 1 tablet by mouth daily. Men  ? ? ? ?MEDICAL DECISION MAKING ? ?A.  ?Encounter Diagnosis  ?Name Primary?  ? Closed nondisplaced fracture of distal phalanx of right thumb, initial encounter Yes  ? ? ?B. DATA ANALYSED: ? ? ?IMAGING: ?Interpretation of images: I have personally reviewed the images and my interpretation is outside imaging fracture distal phalanx right thumb ?Orders: No new orders ? ?Outside records reviewed: Urgent care ? ? ?C. MANAGEMENT  ? ?Nonstick Betadine dressing  finger cage dressing come back for dressing change out of work 2 weeks ? ?Meds ordered this encounter  ?Medications  ? ibuprofen (ADVIL) 800 MG tablet  ?  Sig: Take 1 tablet (800 mg total) by mouth every 8 (eight) hours as needed.  ?  Dispense:  90 tablet  ?  Refill:  1  ? cephALEXin (KEFLEX) 500 MG capsule  ?  Sig: Take 1 capsule (500 mg total) by mouth 2 (two) times daily.  ?  Dispense:  20 capsule  ?  Refill:  0  ? HYDROcodone-acetaminophen (NORCO/VICODIN) 5-325 MG tablet  ?  Sig: Take 1 tablet by mouth every 6 (six) hours as needed for moderate pain.  ?  Dispense:  30 tablet  ?  Refill:  0  ? ? ? ?Fuller Canada, MD ? ?04/10/2022 ?3:21 PM ?

## 2022-04-10 NOTE — Patient Instructions (Signed)
Follow up Thursday 04/20/22 for wound check/bandage change RT thumb DOI 04/06/22 ? ?Pick up your medications at the pharmacy. Give them a couple of hours to get them ready for you. Call us if your wound begins to get worse. [phone] ? ?OOW note until after his next appointment with Dr. Romeo Apple ?

## 2022-04-20 ENCOUNTER — Encounter: Payer: Self-pay | Admitting: Orthopedic Surgery

## 2022-04-20 ENCOUNTER — Ambulatory Visit (INDEPENDENT_AMBULATORY_CARE_PROVIDER_SITE_OTHER): Payer: 59 | Admitting: Orthopedic Surgery

## 2022-04-20 DIAGNOSIS — S62524D Nondisplaced fracture of distal phalanx of right thumb, subsequent encounter for fracture with routine healing: Secondary | ICD-10-CM

## 2022-04-20 NOTE — Progress Notes (Signed)
FOLLOW UP  ? ?Encounter Diagnosis  ?Name Primary?  ? Closed nondisplaced fracture of distal phalanx of right thumb with routine healing, subsequent encounter Yes  ? ? ? ?Chief Complaint  ?Patient presents with  ? Follow-up  ?  Recheck on right thumb, DOI 04-06-22  ? ? ? ?That is post injury to the right thumb 14 days ago here for recheck. ? ?Everything looks good continue soaking out of work a week come back 2 weeks ? ? ?

## 2022-04-28 ENCOUNTER — Encounter: Payer: Self-pay | Admitting: Orthopedic Surgery

## 2022-04-28 ENCOUNTER — Telehealth: Payer: Self-pay | Admitting: Orthopedic Surgery

## 2022-04-28 NOTE — Telephone Encounter (Signed)
Patient called, then came to office to request that his return to work note be issued for 04/28/22, and to indicate full duty, no restrictions.

## 2022-05-01 NOTE — Telephone Encounter (Signed)
ok 

## 2022-05-01 NOTE — Telephone Encounter (Signed)
Done; patient picked up note.

## 2022-05-04 ENCOUNTER — Encounter: Payer: Self-pay | Admitting: Orthopedic Surgery

## 2022-05-04 ENCOUNTER — Ambulatory Visit: Payer: 59 | Admitting: Orthopedic Surgery

## 2023-02-08 ENCOUNTER — Encounter: Payer: Self-pay | Admitting: Radiology

## 2023-05-07 ENCOUNTER — Other Ambulatory Visit: Payer: Self-pay

## 2023-05-07 ENCOUNTER — Encounter (HOSPITAL_COMMUNITY): Payer: Self-pay

## 2023-05-07 ENCOUNTER — Emergency Department (HOSPITAL_COMMUNITY)
Admission: EM | Admit: 2023-05-07 | Discharge: 2023-05-07 | Disposition: A | Discharge status: Home / Self Care | Payer: Medicaid Other | Attending: Emergency Medicine | Admitting: Emergency Medicine

## 2023-05-07 DIAGNOSIS — X58XXXA Exposure to other specified factors, initial encounter: Secondary | ICD-10-CM | POA: Diagnosis not present

## 2023-05-07 DIAGNOSIS — S0502XA Injury of conjunctiva and corneal abrasion without foreign body, left eye, initial encounter: Secondary | ICD-10-CM | POA: Insufficient documentation

## 2023-05-07 DIAGNOSIS — H578A2 Foreign body sensation, left eye: Secondary | ICD-10-CM | POA: Diagnosis not present

## 2023-05-07 MED ORDER — OXYCODONE-ACETAMINOPHEN 5-325 MG PO TABS: 1.0000 | ORAL_TABLET | Freq: Four times a day (QID) | ORAL | 0 refills | Status: DC | PRN

## 2023-05-07 MED ORDER — OXYCODONE-ACETAMINOPHEN 5-325 MG PO TABS
1.0000 | ORAL_TABLET | Freq: Once | ORAL | Status: AC
  Administered 2023-05-07: 1 via ORAL
  Filled 2023-05-07: qty 1

## 2023-05-07 MED ORDER — TETRACAINE HCL 0.5 % OP SOLN
2.0000 [drp] | Freq: Once | OPHTHALMIC | Status: AC
  Administered 2023-05-07: 2 [drp] via OPHTHALMIC
  Filled 2023-05-07: qty 4

## 2023-05-07 MED ORDER — FLUORESCEIN SODIUM 1 MG OP STRP
1.0000 | ORAL_STRIP | Freq: Once | OPHTHALMIC | Status: AC
  Administered 2023-05-07: 1 via OPHTHALMIC
  Filled 2023-05-07: qty 1

## 2023-05-07 MED ORDER — MOXIFLOXACIN HCL 0.5 % OP SOLN: 1.0000 [drp] | Freq: Three times a day (TID) | OPHTHALMIC | 0 refills | Status: DC

## 2023-05-07 NOTE — ED Triage Notes (Signed)
Pt reports he was chopping wood with a machete yesterday and a piece of wood flew into his left eye and it still feels like it is in there.  Pt eye is red, swollen and watering.

## 2023-05-07 NOTE — Discharge Instructions (Signed)
Please fill your prescription for antibiotics and use to your left eye.  Follow-up with ophthalmology office tomorrow.  Return if any worsening or concerning symptoms

## 2023-05-07 NOTE — ED Provider Notes (Signed)
[place] EMERGENCY DEPARTMENT AT [facility] Provider Note   CSN: [id number] Arrival date & time: 05/07/23  1825     History {Add pertinent medical, surgical, social history, OB history to HPI:1} Chief Complaint  Patient presents with   Foreign Body in Eye    Robert Harris is a 42 y.o. male.  He has no significant past medical history.  He is complaining of foreign body sensation and pain in his left eye after he was cutting brush with a machete yesterday.  Symptoms continued today.  He has some photophobia.  Eye has been watery.  The history is provided by the patient.  Foreign Body in Eye This is a new problem. The current episode started yesterday. The problem occurs constantly. The problem has not changed since onset.Pertinent negatives include no chest pain, no abdominal pain, no headaches and no shortness of breath. Exacerbated by: blinking light. Nothing relieves the symptoms. He has tried rest for the symptoms. The treatment provided no relief.       Home Medications Prior to Admission medications   Medication Sig Start Date End Date Taking? Authorizing Provider  Calcium-Magnesium-Zinc (CAL-MAG-ZINC PO) Take 1 capsule by mouth daily at 2 am.    [provider]  cephALEXin (KEFLEX) 500 MG capsule Take 1 capsule (500 mg total) by mouth 2 (two) times daily. 04/10/22   Vickki Hearing, MD  HYDROcodone-acetaminophen (NORCO/VICODIN) 5-325 MG tablet Take 1 tablet by mouth every 6 (six) hours as needed for moderate pain. 04/10/22   Vickki Hearing, MD  ibuprofen (ADVIL) 800 MG tablet Take 1 tablet (800 mg total) by mouth every 8 (eight) hours as needed. 04/10/22   Vickki Hearing, MD  Multiple Vitamins-Minerals (MULTIVITAMIN WITH MINERALS) tablet Take 1 tablet by mouth daily. Men    [provider]      Allergies    Patient has no known allergies.    Review of Systems   Review of Systems  Eyes:  Positive for photophobia, pain and itching.   Respiratory:  Negative for shortness of breath.   Cardiovascular:  Negative for chest pain.  Gastrointestinal:  Negative for abdominal pain.  Neurological:  Negative for headaches.    Physical Exam Updated Vital Signs BP 126/78   Pulse 78   Temp 98.1 F (36.7 C) (Oral)   Resp 16   Ht 5\' 9"  (1.753 m)   Wt 68.5 kg   SpO2 100%   BMI 22.30 kg/m  Physical Exam Vitals and nursing note reviewed.  Constitutional:      Appearance: He is well-developed.  HENT:     Head: Normocephalic and atraumatic.  Eyes:     General: Lids are normal. Lids are everted, no foreign bodies appreciated. Vision grossly intact. Gaze aligned appropriately.     Conjunctiva/sclera:     Left eye: Left conjunctiva is injected. No hemorrhage.    Comments: Patient has a corneal abrasion through the pupillary axis on the cornea.  There is no hyphema or hypopnea.  No obvious foreign body.  No Seidel sign.  Fluorescein exam was done.  Cardiovascular:     Rate and Rhythm: Normal rate and regular rhythm.     Heart sounds: No murmur heard. Pulmonary:     Effort: Pulmonary effort is normal. No respiratory distress.     Breath sounds: Normal breath sounds.  Abdominal:     Palpations: Abdomen is soft.     Tenderness: There is no abdominal tenderness.  Musculoskeletal:  Cervical back: Neck supple.  Skin:    General: Skin is warm and dry.  Neurological:     Mental Status: He is alert.     GCS: GCS eye subscore is 4. GCS verbal subscore is 5. GCS motor subscore is 6.     ED Results / Procedures / Treatments   Labs (all labs ordered are listed, but only abnormal results are displayed) Labs Reviewed - No data to display  EKG None  Radiology No results found.  Procedures Procedures  {Document cardiac monitor, telemetry assessment procedure when appropriate:1}  Medications Ordered in ED Medications  fluorescein ophthalmic strip 1 strip (has no administration in time range)  tetracaine (PONTOCAINE)  0.5 % ophthalmic solution 2 drop (has no administration in time range)    ED Course/ Medical Decision Making/ A&P   {   Click here for ABCD2, HEART and other calculatorsREFRESH Note before signing :1}                          Medical Decision Making Risk Prescription drug management.   ***  {Document critical care time when appropriate:1} {Document review of labs and clinical decision tools ie heart score, Chads2Vasc2 etc:1}  {Document your independent review of radiology images, and any outside records:1} {Document your discussion with family members, caretakers, and with consultants:1} {Document social determinants of health affecting pt's care:1} {Document your decision making why or why not admission, treatments were needed:1} Final Clinical Impression(s) / ED Diagnoses Final diagnoses:  None    Rx / DC Orders ED Discharge Orders     None

## 2023-06-19 DIAGNOSIS — T63301A Toxic effect of unspecified spider venom, accidental (unintentional), initial encounter: Secondary | ICD-10-CM | POA: Diagnosis not present

## 2023-06-19 DIAGNOSIS — R52 Pain, unspecified: Secondary | ICD-10-CM | POA: Diagnosis not present

## 2023-08-12 ENCOUNTER — Encounter (HOSPITAL_COMMUNITY): Payer: Self-pay

## 2023-08-12 ENCOUNTER — Emergency Department (HOSPITAL_COMMUNITY)
Admission: EM | Admit: 2023-08-12 | Discharge: 2023-08-13 | Disposition: A | Discharge status: Home / Self Care | Payer: Medicaid Other | Attending: Emergency Medicine | Admitting: Emergency Medicine

## 2023-08-12 ENCOUNTER — Other Ambulatory Visit: Payer: Self-pay

## 2023-08-12 ENCOUNTER — Emergency Department (HOSPITAL_COMMUNITY): Payer: Medicaid Other

## 2023-08-12 DIAGNOSIS — G8929 Other chronic pain: Secondary | ICD-10-CM | POA: Diagnosis not present

## 2023-08-12 DIAGNOSIS — R519 Headache, unspecified: Secondary | ICD-10-CM | POA: Insufficient documentation

## 2023-08-12 DIAGNOSIS — F17210 Nicotine dependence, cigarettes, uncomplicated: Secondary | ICD-10-CM | POA: Diagnosis not present

## 2023-08-12 MED ORDER — KETOROLAC TROMETHAMINE 15 MG/ML IJ SOLN
30.0000 mg | Freq: Once | INTRAMUSCULAR | Status: AC
  Administered 2023-08-12: 30 mg via INTRAMUSCULAR
  Filled 2023-08-12: qty 2

## 2023-08-12 MED ORDER — HALOPERIDOL LACTATE 5 MG/ML IJ SOLN: 5.0000 mg | Freq: Once | INTRAMUSCULAR | Status: DC

## 2023-08-12 NOTE — ED Triage Notes (Signed)
Pt arrived POV from home c/o a headache x30 mins. Pt did take 1000mg  of tylenol before coming.
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
## 2023-08-13 NOTE — Discharge Instructions (Signed)
You were evaluated in the Emergency Department and after careful evaluation, we did not find any emergent condition requiring admission or further testing in the hospital.  Your exam/testing today was overall reassuring.  No brain or neck abnormalities on your CT scans.  Follow-up with the neurologist for continued care.  Please return to the Emergency Department if you experience any worsening of your condition.  Thank you for allowing [place] to be a part of your care.

## 2023-10-08 DIAGNOSIS — B349 Viral infection, unspecified: Secondary | ICD-10-CM | POA: Diagnosis not present

## 2023-10-08 DIAGNOSIS — Z20822 Contact with and (suspected) exposure to covid-19: Secondary | ICD-10-CM | POA: Diagnosis not present

## 2023-10-08 DIAGNOSIS — R070 Pain in throat: Secondary | ICD-10-CM | POA: Diagnosis not present

## 2023-11-07 ENCOUNTER — Ambulatory Visit (HOSPITAL_COMMUNITY)
Admission: EM | Admit: 2023-11-07 | Discharge: 2023-11-07 | Disposition: A | Discharge status: Other Acute Inpatient Hospital | Payer: Medicaid Other | Attending: Psychiatry | Admitting: Psychiatry

## 2023-11-07 ENCOUNTER — Encounter (HOSPITAL_COMMUNITY): Payer: Self-pay | Admitting: Psychiatry

## 2023-11-07 ENCOUNTER — Inpatient Hospital Stay (HOSPITAL_COMMUNITY)
Admission: AD | Admit: 2023-11-07 | Discharge: 2023-11-13 | DRG: 885 | Disposition: A | Discharge status: Home / Self Care | Payer: Medicaid Other | Source: Intra-hospital | Attending: Psychiatry | Admitting: Psychiatry

## 2023-11-07 ENCOUNTER — Other Ambulatory Visit: Payer: Self-pay

## 2023-11-07 DIAGNOSIS — F332 Major depressive disorder, recurrent severe without psychotic features: Principal | ICD-10-CM | POA: Diagnosis present

## 2023-11-07 DIAGNOSIS — Z9152 Personal history of nonsuicidal self-harm: Secondary | ICD-10-CM | POA: Diagnosis not present

## 2023-11-07 DIAGNOSIS — F22 Delusional disorders: Secondary | ICD-10-CM | POA: Diagnosis not present

## 2023-11-07 DIAGNOSIS — Z5900 Homelessness unspecified: Secondary | ICD-10-CM

## 2023-11-07 DIAGNOSIS — Z5982 Transportation insecurity: Secondary | ICD-10-CM

## 2023-11-07 DIAGNOSIS — Z79899 Other long term (current) drug therapy: Secondary | ICD-10-CM

## 2023-11-07 DIAGNOSIS — F32A Depression, unspecified: Secondary | ICD-10-CM | POA: Diagnosis present

## 2023-11-07 DIAGNOSIS — F1410 Cocaine abuse, uncomplicated: Secondary | ICD-10-CM | POA: Diagnosis present

## 2023-11-07 DIAGNOSIS — F17290 Nicotine dependence, other tobacco product, uncomplicated: Secondary | ICD-10-CM | POA: Diagnosis present

## 2023-11-07 DIAGNOSIS — F1510 Other stimulant abuse, uncomplicated: Secondary | ICD-10-CM | POA: Diagnosis present

## 2023-11-07 DIAGNOSIS — F4310 Post-traumatic stress disorder, unspecified: Secondary | ICD-10-CM | POA: Diagnosis present

## 2023-11-07 DIAGNOSIS — F411 Generalized anxiety disorder: Secondary | ICD-10-CM | POA: Diagnosis present

## 2023-11-07 DIAGNOSIS — Z560 Unemployment, unspecified: Secondary | ICD-10-CM

## 2023-11-07 DIAGNOSIS — Z87828 Personal history of other (healed) physical injury and trauma: Secondary | ICD-10-CM | POA: Diagnosis not present

## 2023-11-07 DIAGNOSIS — F1910 Other psychoactive substance abuse, uncomplicated: Secondary | ICD-10-CM | POA: Insufficient documentation

## 2023-11-07 DIAGNOSIS — F17210 Nicotine dependence, cigarettes, uncomplicated: Secondary | ICD-10-CM | POA: Diagnosis present

## 2023-11-07 DIAGNOSIS — Z9151 Personal history of suicidal behavior: Secondary | ICD-10-CM

## 2023-11-07 DIAGNOSIS — F322 Major depressive disorder, single episode, severe without psychotic features: Secondary | ICD-10-CM | POA: Diagnosis not present

## 2023-11-07 DIAGNOSIS — Z5941 Food insecurity: Secondary | ICD-10-CM

## 2023-11-07 HISTORY — DX: Depression, unspecified: F32.A

## 2023-11-07 HISTORY — DX: Unspecified asthma, uncomplicated: J45.909

## 2023-11-07 LAB — COMPREHENSIVE METABOLIC PANEL
ALT: 19 U/L (ref 0–44)
AST: 22 U/L (ref 15–41)
Albumin: 4 g/dL (ref 3.5–5.0)
Alkaline Phosphatase: 56 U/L (ref 38–126)
Anion gap: 9 (ref 5–15)
BUN: 6 mg/dL (ref 6–20)
CO2: 28 mmol/L (ref 22–32)
Calcium: 9.4 mg/dL (ref 8.9–10.3)
Chloride: 100 mmol/L (ref 98–111)
Creatinine, Ser: 0.78 mg/dL (ref 0.61–1.24)
GFR, Estimated: >60 mL/min (ref >60)
Glucose, Bld: 97 mg/dL (ref 70–99)
Potassium: 3.5 mmol/L (ref 3.5–5.1)
Sodium: 137 mmol/L (ref 135–145)
Total Bilirubin: 0.6 mg/dL (ref <1.2)
Total Protein: 7.3 g/dL (ref 6.5–8.1)

## 2023-11-07 LAB — POCT URINE DRUG SCREEN - MANUAL ENTRY (I-SCREEN)
POC Amphetamine UR: NOT DETECTED
POC Buprenorphine (BUP): NOT DETECTED
POC Cocaine UR: POSITIVE — AB
POC Marijuana UR: POSITIVE — AB
POC Methadone UR: NOT DETECTED
POC Methamphetamine UR: NOT DETECTED
POC Morphine: NOT DETECTED
POC Oxazepam (BZO): NOT DETECTED
POC Oxycodone UR: NOT DETECTED
POC Secobarbital (BAR): NOT DETECTED

## 2023-11-07 LAB — URINALYSIS, COMPLETE (UACMP) WITH MICROSCOPIC
Bacteria, UA: NONE SEEN
Bilirubin Urine: NEGATIVE
Glucose, UA: NEGATIVE mg/dL
Hgb urine dipstick: NEGATIVE
Ketones, ur: NEGATIVE mg/dL
Leukocytes,Ua: NEGATIVE
Nitrite: NEGATIVE
Protein, ur: NEGATIVE mg/dL
Specific Gravity, Urine: 1.006 (ref 1.005–1.030)
pH: 6 (ref 5.0–8.0)

## 2023-11-07 LAB — CBC WITH DIFFERENTIAL/PLATELET
Abs Immature Granulocytes: 0.02 10*3/uL (ref 0.00–0.07)
Basophils Absolute: 0.1 10*3/uL (ref 0.0–0.1)
Basophils Relative: 1 %
Eosinophils Absolute: 0.2 10*3/uL (ref 0.0–0.5)
Eosinophils Relative: 3 %
HCT: 46.8 % (ref 39.0–52.0)
Hemoglobin: 15.5 g/dL (ref 13.0–17.0)
Immature Granulocytes: 0 %
Lymphocytes Relative: 30 %
Lymphs Abs: 2.4 10*3/uL (ref 0.7–4.0)
MCH: 31 pg (ref 26.0–34.0)
MCHC: 33.1 g/dL (ref 30.0–36.0)
MCV: 93.6 fL (ref 80.0–100.0)
Monocytes Absolute: 0.7 10*3/uL (ref 0.1–1.0)
Monocytes Relative: 8 %
Neutro Abs: 4.6 10*3/uL (ref 1.7–7.7)
Neutrophils Relative %: 58 %
Platelets: 362 10*3/uL (ref 150–400)
RBC: 5 MIL/uL (ref 4.22–5.81)
RDW: 13 % (ref 11.5–15.5)
WBC: 8 10*3/uL (ref 4.0–10.5)
nRBC: 0 % (ref 0.0–0.2)

## 2023-11-07 MED ORDER — LORAZEPAM 2 MG/ML IJ SOLN: 2.0000 mg | Freq: Three times a day (TID) | INTRAMUSCULAR | Status: DC | PRN

## 2023-11-07 MED ORDER — ACETAMINOPHEN 325 MG PO TABS: 650.0000 mg | ORAL_TABLET | Freq: Four times a day (QID) | ORAL | Status: DC | PRN

## 2023-11-07 MED ORDER — HALOPERIDOL 5 MG PO TABS: 5.0000 mg | ORAL_TABLET | Freq: Three times a day (TID) | ORAL | Status: DC | PRN

## 2023-11-07 MED ORDER — DIPHENHYDRAMINE HCL 25 MG PO CAPS: 50.0000 mg | ORAL_CAPSULE | Freq: Three times a day (TID) | ORAL | Status: DC | PRN

## 2023-11-07 MED ORDER — ALUM & MAG HYDROXIDE-SIMETH 200-200-20 MG/5ML PO SUSP: 30.0000 mL | ORAL | Status: DC | PRN

## 2023-11-07 MED ORDER — DIPHENHYDRAMINE HCL 50 MG/ML IJ SOLN: 50.0000 mg | Freq: Three times a day (TID) | INTRAMUSCULAR | Status: DC | PRN

## 2023-11-07 MED ORDER — ACETAMINOPHEN 325 MG PO TABS
650.0000 mg | ORAL_TABLET | Freq: Four times a day (QID) | ORAL | Status: DC | PRN
  Administered 2023-11-11: 650 mg via ORAL
  Filled 2023-11-07: qty 2

## 2023-11-07 MED ORDER — MAGNESIUM HYDROXIDE 400 MG/5ML PO SUSP: 30.0000 mL | Freq: Every day | ORAL | Status: DC | PRN

## 2023-11-07 MED ORDER — HALOPERIDOL LACTATE 5 MG/ML IJ SOLN: 10.0000 mg | Freq: Three times a day (TID) | INTRAMUSCULAR | Status: DC | PRN

## 2023-11-07 MED ORDER — TRAZODONE HCL 100 MG PO TABS
100.0000 mg | ORAL_TABLET | Freq: Every day | ORAL | Status: DC
  Filled 2023-11-07 (×3): qty 1

## 2023-11-07 MED ORDER — HALOPERIDOL LACTATE 5 MG/ML IJ SOLN: 5.0000 mg | Freq: Three times a day (TID) | INTRAMUSCULAR | Status: DC | PRN

## 2023-11-07 NOTE — ED Notes (Signed)
Consent form signed.
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
## 2023-11-07 NOTE — ED Notes (Signed)
Pt asleep at this hour. No apparent distress. RR even and unlabored. Monitored for safety.

## 2023-11-07 NOTE — ED Notes (Signed)
Safe transport called 

## 2023-11-07 NOTE — ED Notes (Signed)
Patient presents to [facility] for depression and substance misuse. He denies SI/HI or AVH. Patient contracted for safety should thoughts of self harm arise. Skin check conducted by this nurse and Donata Clay, MHT. Patient oriented to the unit. He denies physical pain or discomfort. He denies any needs at this time. Will continue to monitor for safety.
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
## 2023-11-07 NOTE — [facility] Group Notes (Signed)
Pt did not attend NA group 

## 2023-11-07 NOTE — Tx Team (Signed)
Initial Treatment Plan 11/07/2023 10:51 PM [name] NWG:[id number]    PATIENT STRESSORS: Traumatic event     PATIENT STRENGTHS: Ability for insight  Average or above average intelligence  General fund of knowledge  Supportive family/friends    PATIENT IDENTIFIED PROBLEMS: Alteration in mood depressed  anxiety                   DISCHARGE CRITERIA:  Ability to meet basic life and health needs Improved stabilization in mood, thinking, and/or behavior Need for constant or close observation no longer present Reduction of life-threatening or endangering symptoms to within safe limits  PRELIMINARY DISCHARGE PLAN: Outpatient therapy Return to previous living arrangement  PATIENT/FAMILY INVOLVEMENT: This treatment plan has been presented to and reviewed with the patient, Robert Harris, and/or family member, The patient and family have been given the opportunity to ask questions and make suggestions.  Cherene Altes, RN 11/07/2023, 10:51 PM

## 2023-11-07 NOTE — ED Notes (Signed)
Report given to Cari RN@[facility]  adult unit

## 2023-11-07 NOTE — ED Notes (Signed)
Patient observed resting quietly, eyes closed. Respirations equal and unlabored. Will continue to monitor for safety.  

## 2023-11-07 NOTE — ED Notes (Signed)
Call placed to give report. Was advised that the receiving nurse was busy and she will return my call.

## 2023-11-07 NOTE — ED Notes (Signed)
Called to give report but was asked to call after 8 due to something occurring on the unit. Will pass along to night shift

## 2023-11-07 NOTE — ED Notes (Signed)
 [facility] called for specimen pickup

## 2023-11-07 NOTE — Progress Notes (Signed)
Pt has been accepted to [facility] today 11/07/2023 Bed assignment: 307-1 pending labs, vol, UDS, EKG.   Pt meets inpatient criteria per: Phebe Colla NP   Attending Physician will be: Dr. Sarita Bottom, MD   Report can be called to:  Adult unit: [phone]  Pt can arrive after pending labs, vol, UDS, EKG.   Care Team Notified: [facility] Rona Ravens RN, Phebe Colla NP, Deatra Canter RN, [place] Noe Fiscus RN    [place] Makyle Eslick LCSW-A   11/07/2023 2:25 PM
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
## 2023-11-08 DIAGNOSIS — F1910 Other psychoactive substance abuse, uncomplicated: Secondary | ICD-10-CM

## 2023-11-08 DIAGNOSIS — F332 Major depressive disorder, recurrent severe without psychotic features: Secondary | ICD-10-CM | POA: Diagnosis not present

## 2023-11-08 DIAGNOSIS — F411 Generalized anxiety disorder: Secondary | ICD-10-CM | POA: Diagnosis present

## 2023-11-08 DIAGNOSIS — F4310 Post-traumatic stress disorder, unspecified: Secondary | ICD-10-CM | POA: Diagnosis present

## 2023-11-08 HISTORY — DX: Other psychoactive substance abuse, uncomplicated: F19.10

## 2023-11-08 HISTORY — DX: Generalized anxiety disorder: F41.1

## 2023-11-08 HISTORY — DX: Major depressive disorder, recurrent severe without psychotic features: F33.2

## 2023-11-08 MED ORDER — SERTRALINE HCL 25 MG PO TABS
25.0000 mg | ORAL_TABLET | Freq: Every day | ORAL | Status: DC
  Administered 2023-11-08 – 2023-11-10 (×3): 25 mg via ORAL
  Filled 2023-11-08 (×6): qty 1

## 2023-11-08 MED ORDER — TRAZODONE HCL 50 MG PO TABS
50.0000 mg | ORAL_TABLET | Freq: Every evening | ORAL | Status: DC | PRN
  Administered 2023-11-09 – 2023-11-12 (×3): 50 mg via ORAL
  Filled 2023-11-08 (×4): qty 1

## 2023-11-08 MED ORDER — QUETIAPINE FUMARATE 50 MG PO TABS
50.0000 mg | ORAL_TABLET | Freq: Every day | ORAL | Status: DC
  Administered 2023-11-08 – 2023-11-12 (×5): 50 mg via ORAL
  Filled 2023-11-08 (×7): qty 1

## 2023-11-08 NOTE — Progress Notes (Addendum)
Pt denied SI/HI/AVH this morning. Pt has been pleasant, calm, and cooperative throughout the shift. RN provided support and encouragement to patient. Pt given scheduled medications as prescribed. Q15 min checks verified for safety. Patient verbally contracts for safety. Patient compliant with medications and treatment plan. Patient is interacting well on the unit. Pt is safe on the unit.   11/08/23 1000  Psych Admission Type (Psych Patients Only)  Admission Status Voluntary  Psychosocial Assessment  Patient Complaints Anxiety;Depression  Eye Contact Brief  Facial Expression Flat  Affect Flat  Speech Logical/coherent  Interaction Minimal  Motor Activity Slow  Appearance/Hygiene Disheveled;Body odor  Behavior Characteristics Cooperative;Anxious  Mood Anxious;Depressed  Thought Process  Coherency WDL  Content WDL  Delusions WDL  Perception WDL  Hallucination None reported or observed  Judgment Impaired  Confusion None  Danger to Self  Current suicidal ideation? Denies  Agreement Not to Harm Self Yes  Description of Agreement Verbal  Danger to Others  Danger to Others None reported or observed
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
## 2023-11-08 NOTE — Progress Notes (Signed)
   11/08/23 0603  15 Minute Checks  Location Bedroom  Visual Appearance Calm  Behavior Composed  Sleep (Behavioral Health Patients Only)  Calculate sleep? (Click Yes once per 24 hr at 0600 safety check) Yes  Documented sleep last 24 hours 7.25
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
## 2023-11-08 NOTE — Plan of Care (Signed)
  Problem: Education: Goal: Emotional status will improve Outcome: Progressing Goal: Mental status will improve Outcome: Progressing   Problem: Activity: Goal: Interest or engagement in activities will improve Outcome: Progressing Goal: Sleeping patterns will improve Outcome: Progressing

## 2023-11-08 NOTE — Group Note (Signed)
[facility] LCSW Group Therapy Note  Date/Time: 11/08/2023 at 11:00AM - 12:00PM  Type of Therapy/Topic:  Group Therapy:  Journey and Not the Destination  Participation Level:  Patient did not attend group on today. Patients are encouraged to participate in all programming on milieu.   Fernande Boyden, LCSW Clinical Social Worker [facility]
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
## 2023-11-09 ENCOUNTER — Encounter (HOSPITAL_COMMUNITY): Payer: Self-pay

## 2023-11-09 DIAGNOSIS — F332 Major depressive disorder, recurrent severe without psychotic features: Secondary | ICD-10-CM | POA: Diagnosis not present

## 2023-11-09 LAB — HEMOGLOBIN A1C
Hgb A1c MFr Bld: 5.4 % (ref 4.8–5.6)
Mean Plasma Glucose: 108.28 mg/dL

## 2023-11-09 LAB — LIPID PANEL
Cholesterol: 185 mg/dL (ref 0–200)
HDL: 55 mg/dL (ref >40)
LDL Cholesterol: 108 mg/dL — ABNORMAL HIGH (ref 0–99)
Total CHOL/HDL Ratio: 3.4 {ratio}
Triglycerides: 109 mg/dL (ref <150)
VLDL: 22 mg/dL (ref 0–40)

## 2023-11-09 LAB — TSH: TSH: 1.479 u[IU]/mL (ref 0.350–4.500)
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
## 2023-11-09 NOTE — Plan of Care (Signed)
  Problem: Education: Goal: Emotional status will improve Outcome: Progressing Goal: Mental status will improve Outcome: Progressing   Problem: Activity: Goal: Interest or engagement in activities will improve Outcome: Progressing Goal: Sleeping patterns will improve Outcome: Progressing

## 2023-11-09 NOTE — [facility] Group Notes (Signed)
[facility] Group Notes:  (Nursing/MHT/Case Management/Adjunct)  Date:  11/09/2023  Time:  1:50 AM  Type of Therapy:   Wrap-up group  Participation Level:  Active  Participation Quality:  Appropriate  Affect:  Appropriate  Cognitive:  Appropriate  Insight:  Appropriate  Engagement in Group:  Engaged  Modes of Intervention:  Education  Summary of Progress/Problems: Goal get on meds. Rated day 5/10.  Robert Harris 11/09/2023, 1:50 AM

## 2023-11-09 NOTE — BH IP Treatment Plan (Signed)
Interdisciplinary Treatment and Diagnostic Plan Update  11/09/2023 Time of Session: 10:45 am Robert Harris MRN: [id number]  Principal Diagnosis: MDD (major depressive disorder), recurrent severe, without psychosis (HCC)  Secondary Diagnoses: Principal Problem:   MDD (major depressive disorder), recurrent severe, without psychosis (HCC) Active Problems:   GAD (generalized anxiety disorder)   PTSD (post-traumatic stress disorder)   Polysubstance abuse (HCC)   Current Medications:  Current Facility-Administered Medications  Medication Dose Route Frequency Provider Last Rate Last Admin   acetaminophen (TYLENOL) tablet 650 mg  650 mg Oral Q6H PRN Weber, Kyra A, NP       alum & mag hydroxide-simeth (MAALOX/MYLANTA) 200-200-20 MG/5ML suspension 30 mL  30 mL Oral Q4H PRN Weber, Kyra A, NP       haloperidol (HALDOL) tablet 5 mg  5 mg Oral TID PRN Weber, Kyra A, NP       And   diphenhydrAMINE (BENADRYL) capsule 50 mg  50 mg Oral TID PRN Weber, Kyra A, NP       haloperidol lactate (HALDOL) injection 5 mg  5 mg Intramuscular TID PRN Weber, Kyra A, NP       And   diphenhydrAMINE (BENADRYL) injection 50 mg  50 mg Intramuscular TID PRN Weber, Bella Kennedy A, NP       And   LORazepam (ATIVAN) injection 2 mg  2 mg Intramuscular TID PRN Weber, Kyra A, NP       haloperidol lactate (HALDOL) injection 10 mg  10 mg Intramuscular TID PRN Weber, Kyra A, NP       And   diphenhydrAMINE (BENADRYL) injection 50 mg  50 mg Intramuscular TID PRN Weber, Kyra A, NP       And   LORazepam (ATIVAN) injection 2 mg  2 mg Intramuscular TID PRN Weber, Kyra A, NP       magnesium hydroxide (MILK OF MAGNESIA) suspension 30 mL  30 mL Oral Daily PRN Weber, Kyra A, NP       QUEtiapine (SEROQUEL) tablet 50 mg  50 mg Oral QHS Lauro Franklin, MD   50 mg at 11/08/23 2115   sertraline (ZOLOFT) tablet 25 mg  25 mg Oral Daily Lauro Franklin, MD   25 mg at 11/09/23 0810   traZODone (DESYREL) tablet 50 mg  50 mg Oral QHS PRN  Lauro Franklin, MD       PTA Medications: Medications Prior to Admission  Medication Sig Dispense Refill Last Dose   albuterol (VENTOLIN HFA) 108 (90 Base) MCG/ACT inhaler Inhale 2 puffs into the lungs every 6 (six) hours as needed for wheezing or shortness of breath.       Patient Stressors: Traumatic event    Patient Strengths: Ability for insight  Average or above average intelligence  General fund of knowledge  Supportive family/friends   Treatment Modalities: Medication Management, Group therapy, Case management,  1 to 1 session with clinician, Psychoeducation, Recreational therapy.   Physician Treatment Plan for Primary Diagnosis: MDD (major depressive disorder), recurrent severe, without psychosis (HCC) Long Term Goal(s): Improvement in symptoms so as ready for discharge   Short Term Goals: Ability to identify changes in lifestyle to reduce recurrence of condition will improve Ability to verbalize feelings will improve Ability to disclose and discuss suicidal ideas Ability to demonstrate self-control will improve Ability to identify and develop effective coping behaviors will improve Ability to maintain clinical measurements within normal limits will improve Ability to identify triggers associated with substance abuse/mental health issues will improve  Medication  Management: Evaluate patient's response, side effects, and tolerance of medication regimen.  Therapeutic Interventions: 1 to 1 sessions, Unit Group sessions and Medication administration.  Evaluation of Outcomes: Not Progressing  Physician Treatment Plan for Secondary Diagnosis: Principal Problem:   MDD (major depressive disorder), recurrent severe, without psychosis (HCC) Active Problems:   GAD (generalized anxiety disorder)   PTSD (post-traumatic stress disorder)   Polysubstance abuse (HCC)  Long Term Goal(s): Improvement in symptoms so as ready for discharge   Short Term Goals: Ability to  identify changes in lifestyle to reduce recurrence of condition will improve Ability to verbalize feelings will improve Ability to disclose and discuss suicidal ideas Ability to demonstrate self-control will improve Ability to identify and develop effective coping behaviors will improve Ability to maintain clinical measurements within normal limits will improve Ability to identify triggers associated with substance abuse/mental health issues will improve     Medication Management: Evaluate patient's response, side effects, and tolerance of medication regimen.  Therapeutic Interventions: 1 to 1 sessions, Unit Group sessions and Medication administration.  Evaluation of Outcomes: Not Progressing   RN Treatment Plan for Primary Diagnosis: MDD (major depressive disorder), recurrent severe, without psychosis (HCC) Long Term Goal(s): Knowledge of disease and therapeutic regimen to maintain health will improve  Short Term Goals: Ability to remain free from injury will improve, Ability to verbalize frustration and anger appropriately will improve, Ability to demonstrate self-control, Ability to participate in decision making will improve, Ability to verbalize feelings will improve, Ability to disclose and discuss suicidal ideas, Ability to identify and develop effective coping behaviors will improve, and Compliance with prescribed medications will improve  Medication Management: RN will administer medications as ordered by provider, will assess and evaluate patient's response and provide education to patient for prescribed medication. RN will report any adverse and/or side effects to prescribing provider.  Therapeutic Interventions: 1 on 1 counseling sessions, Psychoeducation, Medication administration, Evaluate responses to treatment, Monitor vital signs and CBGs as ordered, Perform/monitor CIWA, COWS, AIMS and Fall Risk screenings as ordered, Perform wound care treatments as ordered.  Evaluation of  Outcomes: Not Progressing   LCSW Treatment Plan for Primary Diagnosis: MDD (major depressive disorder), recurrent severe, without psychosis (HCC) Long Term Goal(s): Safe transition to appropriate next level of care at discharge, Engage patient in therapeutic group addressing interpersonal concerns.  Short Term Goals: Engage patient in aftercare planning with referrals and resources, Increase social support, Increase ability to appropriately verbalize feelings, Increase emotional regulation, Facilitate acceptance of mental health diagnosis and concerns, Facilitate patient progression through stages of change regarding substance use diagnoses and concerns, Identify triggers associated with mental health/substance abuse issues, and Increase skills for wellness and recovery  Therapeutic Interventions: Assess for all discharge needs, 1 to 1 time with Social worker, Explore available resources and support systems, Assess for adequacy in community support network, Educate family and significant other(s) on suicide prevention, Complete Psychosocial Assessment, Interpersonal group therapy.  Evaluation of Outcomes: Not Progressing   Progress in Treatment: Attending groups: Yes. Participating in groups: Yes. Taking medication as prescribed: Yes. Toleration medication: Yes. Family/Significant other contact made: Yes, individual(s) contacted:  father, Darwin Manzanares [phone] Patient understands diagnosis: Yes. Discussing patient identified problems/goals with staff: Yes. Medical problems stabilized or resolved: Yes. Denies suicidal/homicidal ideation: Yes. Issues/concerns per patient self-inventory: No. Other: none reported  New problem(s) identified: No, Describe:  none reported  New Short Term/Long Term Goal(s): medication stabilization, elimination of SI thoughts, development of comprehensive mental wellness plan.    Patient  Goals:  " I want get on track with my life and get my depression  under control"    Discharge Plan or Barriers: Patient recently admitted. CSW will continue to follow and assess for appropriate referrals and possible discharge planning.    Reason for Continuation of Hospitalization: Anxiety Depression Suicidal ideation  Estimated Length of Stay: 5-7 days  Last 3 [place] Suicide Severity Risk Score: Flowsheet Row [facility] Most recent reading at 11/07/2023 10:55 PM ED from 11/07/2023 in [facility] Most recent reading at 11/07/2023  3:42 PM ED from 08/12/2023 in [facility] Emergency Department at [facility] Most recent reading at 08/12/2023  7:02 PM  C-SSRS RISK CATEGORY High Risk High Risk No Risk       Last PHQ 2/9 Scores:     No data to display          Scribe for Treatment Team: Kathrynn Humble 11/09/2023 11:02 AM

## 2023-11-09 NOTE — Progress Notes (Signed)
   11/08/23 2027  Psych Admission Type (Psych Patients Only)  Admission Status Voluntary  Psychosocial Assessment  Patient Complaints Depression  Eye Contact Brief  Facial Expression Flat  Affect Flat  Speech Logical/coherent  Interaction Minimal  Motor Activity Slow  Appearance/Hygiene Unremarkable  Behavior Characteristics Cooperative;Anxious  Mood Anxious;Depressed  Thought Process  Coherency WDL  Content WDL  Delusions WDL  Perception WDL  Hallucination None reported or observed  Judgment Impaired  Confusion None  Danger to Self  Current suicidal ideation? Denies  Description of Agreement verbal  Danger to Others  Danger to Others None reported or observed

## 2023-11-09 NOTE — Progress Notes (Signed)
   11/09/23 0912  Psych Admission Type (Psych Patients Only)  Admission Status Voluntary  Psychosocial Assessment  Patient Complaints Depression;Anxiety  Eye Contact Brief  Facial Expression Flat  Affect Flat;Depressed  Speech Logical/coherent  Interaction Minimal;Isolative  Motor Activity Slow  Appearance/Hygiene Unremarkable  Behavior Characteristics Cooperative  Mood Depressed  Thought Process  Coherency WDL  Content WDL  Delusions WDL  Perception WDL  Hallucination None reported or observed  Judgment Impaired  Confusion None  Danger to Self  Current suicidal ideation? Denies  Agreement Not to Harm Self Yes  Description of Agreement Verbal  Danger to Others  Danger to Others None reported or observed

## 2023-11-09 NOTE — Group Note (Signed)
Recreation Therapy Group Note   Group Topic:Problem Solving  Group Date: 11/09/2023 Start Time: 0930 End Time: 1000 Facilitators: Armel Rabbani-McCall, LRT,CTRS Location: [address] Dayroom   Group Topic: Problem Solving  Goal Area(s) Addresses:  Patient will effectively work in a team with other group members. Patient will verbalize importance of using appropriate problem solving techniques.  Patient will identify positive change associated with effective problem solving skills.   Intervention: Worksheets, Pencils  Group Description: Dentist. Patients were given two sheets of brain teasers. Patients were given 20 minutes to try and figure out as many of teasers they could. Patients were also allowed to work together if they chose to. Once patients finished, LRT would go over the answers with the patients.    Education Outcome: Acknowledges understanding/In group clarification offered/Needs additional education.    Clinical Observations/Individualized Feedback: Due to previous group going over/exceeding time, recreation therapy group was unable to be held at scheduled time.     Plan: Continue to engage patient in RT group sessions 2-3x/week.   Blanca Thornton-McCall, LRT,CTRS 11/09/2023 1:23 PM

## 2023-11-09 NOTE — Progress Notes (Signed)
LCSW went and spoke with patient at bedside. Patient reports an interest in long-term or short term treatment for substance use. Patient reports he has been homeless in [facility] for the last month. Patient is from [place], [place] and reports having some support from family. Patient reports he is willing to do whatever to help himself. Patient aware that LCSW will follow up with [facility] in [place] to see if he would qualify for services since he has been homeless in [place]. Updates will be provided once received. Current recommendation would be for residential placement, however if unable to secure then patient will need to consider sober living options.  LCSW provided the patient with a list of long-term facilities, Sober Living facilities, and [facility] that he could follow up with regarding bed availability. LCSW also followed up to see if patient was interested in [facility] in the local area and patient stated "yes'. List of [address] provided for [employer], [place], and [place] for his follow up as well. LCSW will follow up at a later time for updates.   Fernande Boyden, LCSW Clinical Social Worker [facility]

## 2023-11-09 NOTE — Progress Notes (Signed)
[facility] MD Progress Note  11/09/2023 12:51 PM Robert Harris  MRN:  [id number] Subjective:   Robert Harris is a 42 yr old male who presented on 11/27 to [facility] with worsening depression, grief, and SI, he was admitted to [facility] on 11/28. PPHx is significant for Depression, Anxiety, PTSD, and Polysubstance Abuse (Meth, EtOH, Cocaine, Acid, Shrooms), and Prior Suicide Attempts (put gun in mouth and pulled trigger and walked in traffic), a remote history of Self Injurious Behavior (Burning with lighter- years ago), and no Prior Psychiatric Hospitalizations.    Case was discussed in the multidisciplinary team. MAR was reviewed and patient was compliant with medications.  He did not receive any PRN's yesterday.   Psychiatric Team made the following recommendations yesterday: -Start Zoloft 25 mg daily for depression and anxiety -Start Seroquel 50 mg QHS for augmentation, sleep, and appetite    On interview today patient reports he slept good last night.  He reports his appetite is still doing poor.  He reports no SI, HI, or AVH.  He reports no Paranoia or Ideas of Reference.  He reports no issues with his medications other than some tiredness this morning.  Discussed with him that this is common the first few days with Seroquel and should improve and will monitor him.  Discussed with him that Social Work would discuss Residential Rehab options with him.  He reports no withdrawal symptoms.  He reports no cravings except for a cigarette.  He reports no other concerns at present.   Principal Problem: MDD (major depressive disorder), recurrent severe, without psychosis (HCC) Diagnosis: Principal Problem:   MDD (major depressive disorder), recurrent severe, without psychosis (HCC) Active Problems:   GAD (generalized anxiety disorder)   PTSD (post-traumatic stress disorder)   Polysubstance abuse (HCC)  Total Time spent with patient:  I personally spent 35 minutes on the unit in direct patient  care. The direct patient care time included face-to-face time with the patient, reviewing the patient's chart, communicating with other professionals, and coordinating care. Greater than 50% of this time was spent in counseling or coordinating care with the patient regarding goals of hospitalization, psycho-education, and discharge planning needs.   Past Psychiatric History: Depression, Anxiety, PTSD, and Polysubstance Abuse (Meth, EtOH, Cocaine, Acid, Shrooms), and Prior Suicide Attempts (put gun in mouth and pulled trigger and walked in traffic), a remote history of Self Injurious Behavior (Burning with lighter- years ago), and no Prior Psychiatric Hospitalizations.   Past Medical History:  Past Medical History:  Diagnosis Date   Anxiety    Asthma     Past Surgical History:  Procedure Laterality Date   CLOSED REDUCTION METACARPAL WITH PERCUTANEOUS PINNING Left 02/12/2021   Procedure: open reduction and internal fixation of metacarpal left thumb;  Surgeon: Bradly Bienenstock, MD;  Location: [facility] OR;  Service: Orthopedics;  Laterality: Left;  with IV sedation   COLONOSCOPY     TONSILLECTOMY     WISDOM TOOTH EXTRACTION     Family History: History reviewed. No pertinent family history. Family Psychiatric  History:  Father- EtOH Abuse Mother- Cocaine Abuse Brother- Substance Abuse No Known Diagnosis' or Suicides   Social History:  Social History   Substance and Sexual Activity  Alcohol Use Yes   Alcohol/week: 6.0 standard drinks of alcohol   Types: 6 Cans of beer per week   Comment: few times weekly     Social History   Substance and Sexual Activity  Drug Use Yes   Types: Marijuana, Cocaine, Methamphetamines  Social History   Socioeconomic History   Marital status: Single    Spouse name: Not on file   Number of children: Not on file   Years of education: Not on file   Highest education level: Not on file  Occupational History   Not on file  Tobacco Use   Smoking status:  Every Day    Current packs/day: 0.25    Average packs/day: 0.3 packs/day for 20.0 years (5.0 ttl pk-yrs)    Types: Cigarettes   Smokeless tobacco: Current    Types: Chew   Tobacco comments:    "every now and then"  Vaping Use   Vaping status: Every Day   Substances: Nicotine, THC  Substance and Sexual Activity   Alcohol use: Yes    Alcohol/week: 6.0 standard drinks of alcohol    Types: 6 Cans of beer per week    Comment: few times weekly   Drug use: Yes    Types: Marijuana, Cocaine, Methamphetamines   Sexual activity: Not Currently  Other Topics Concern   Not on file  Social History Narrative   Not on file   Social Determinants of Health   Financial Resource Strain: Not on file  Food Insecurity: Food Insecurity Present (11/07/2023)   Hunger Vital Sign    Worried About Running Out of Food in the Last Year: Often true    Ran Out of Food in the Last Year: Often true  Transportation Needs: Unmet Transportation Needs (11/07/2023)   PRAPARE - Administrator, Civil Service (Medical): Yes    Lack of Transportation (Non-Medical): Yes  Physical Activity: Not on file  Stress: Not on file  Social Connections: Unknown (01/22/2023)   [facility] from [facility]   Social Network    Social Network: Not on file   Additional Social History:                         Sleep: Good  Appetite:  Poor  Current Medications: Current Facility-Administered Medications  Medication Dose Route Frequency Provider Last Rate Last Admin   acetaminophen (TYLENOL) tablet 650 mg  650 mg Oral Q6H PRN Weber, Kyra A, NP       alum & mag hydroxide-simeth (MAALOX/MYLANTA) 200-200-20 MG/5ML suspension 30 mL  30 mL Oral Q4H PRN Weber, Kyra A, NP       haloperidol (HALDOL) tablet 5 mg  5 mg Oral TID PRN Weber, Kyra A, NP       And   diphenhydrAMINE (BENADRYL) capsule 50 mg  50 mg Oral TID PRN Weber, Kyra A, NP       haloperidol lactate (HALDOL) injection 5 mg  5 mg  Intramuscular TID PRN Weber, Kyra A, NP       And   diphenhydrAMINE (BENADRYL) injection 50 mg  50 mg Intramuscular TID PRN Weber, Kyra A, NP       And   LORazepam (ATIVAN) injection 2 mg  2 mg Intramuscular TID PRN Weber, Kyra A, NP       haloperidol lactate (HALDOL) injection 10 mg  10 mg Intramuscular TID PRN Weber, Kyra A, NP       And   diphenhydrAMINE (BENADRYL) injection 50 mg  50 mg Intramuscular TID PRN Weber, Kyra A, NP       And   LORazepam (ATIVAN) injection 2 mg  2 mg Intramuscular TID PRN Weber, Kyra A, NP       magnesium hydroxide (MILK OF  MAGNESIA) suspension 30 mL  30 mL Oral Daily PRN Weber, Kyra A, NP       QUEtiapine (SEROQUEL) tablet 50 mg  50 mg Oral QHS Lauro Franklin, MD   50 mg at 11/08/23 2115   sertraline (ZOLOFT) tablet 25 mg  25 mg Oral Daily Lauro Franklin, MD   25 mg at 11/09/23 0810   traZODone (DESYREL) tablet 50 mg  50 mg Oral QHS PRN Lauro Franklin, MD        Lab Results:  Results for orders placed or performed during the hospital encounter of 11/07/23 (from the past 48 hour(s))  Lipid panel     Status: Abnormal   Collection Time: 11/09/23  6:47 AM  Result Value Ref Range   Cholesterol 185 0 - 200 mg/dL   Triglycerides 130 <865 mg/dL   HDL 55 >78 mg/dL   Total CHOL/HDL Ratio 3.4 RATIO   VLDL 22 0 - 40 mg/dL   LDL Cholesterol 469 (H) 0 - 99 mg/dL    Comment:        Total Cholesterol/HDL:CHD Risk Coronary Heart Disease Risk Table                     Men   Women  1/2 Average Risk   3.4   3.3  Average Risk       5.0   4.4  2 X Average Risk   9.6   7.1  3 X Average Risk  23.4   11.0        Use the calculated Patient Ratio above and the CHD Risk Table to determine the patient's CHD Risk.        ATP III CLASSIFICATION (LDL):  <100     mg/dL   Optimal  629-528  mg/dL   Near or Above                    Optimal  130-159  mg/dL   Borderline  413-244  mg/dL   High  >010     mg/dL   Very High Performed at [facility], [address], [place], [place] [address]   Hemoglobin A1c     Status: None   Collection Time: 11/09/23  6:47 AM  Result Value Ref Range   Hgb A1c MFr Bld 5.4 4.8 - 5.6 %    Comment: (NOTE) Pre diabetes:          5.7%-6.4%  Diabetes:              >6.4%  Glycemic control for   <7.0% adults with diabetes    Mean Plasma Glucose 108.28 mg/dL    Comment: Performed at [facility], [address] [place], [place] [address]  TSH     Status: None   Collection Time: 11/09/23  6:47 AM  Result Value Ref Range   TSH 1.479 0.350 - 4.500 uIU/mL    Comment: Performed by a 3rd Generation assay with a functional sensitivity of <=0.01 uIU/mL. Performed at [facility], [address], [place], [place] [address]     Blood Alcohol level:  Lab Results  Component Value Date   [facility]  07/27/2009    <5        LOWEST DETECTABLE LIMIT FOR SERUM ALCOHOL IS 5 mg/dL FOR MEDICAL PURPOSES ONLY    Metabolic Disorder Labs: Lab Results  Component Value Date   HGBA1C 5.4 11/09/2023   MPG 108.28  11/09/2023   No results found for: "PROLACTIN" Lab Results  Component Value Date   CHOL 185 11/09/2023   TRIG 109 11/09/2023   HDL 55 11/09/2023   CHOLHDL 3.4 11/09/2023   VLDL 22 11/09/2023   LDLCALC 108 (H) 11/09/2023    Physical Findings: AIMS:  , ,  ,  ,    CIWA:    COWS:     Musculoskeletal: Strength & Muscle Tone: within normal limits Gait & Station: normal Patient leans: N/A  Psychiatric Specialty Exam:  Presentation  General Appearance:  Disheveled  Eye Contact: Fair  Speech: Clear and Coherent; Normal Rate  Speech Volume: Normal  Handedness: Right   Mood and Affect  Mood: Depressed  Affect: Congruent   Thought Process  Thought Processes: Coherent  Descriptions of Associations:Intact  Orientation:Full (Time, Place and Person)  Thought Content:Logical; WDL  History of Schizophrenia/Schizoaffective disorder:No  Duration  of Psychotic Symptoms:No data recorded Hallucinations:Hallucinations: None  Ideas of Reference:None  Suicidal Thoughts:Suicidal Thoughts: No SI Passive Intent and/or Plan: Without Intent; Without Plan  Homicidal Thoughts:Homicidal Thoughts: No   Sensorium  Memory: Immediate Good; Recent Good  Judgment: Fair  Insight: Fair   Executive Functions  Concentration: Good  Attention Span: Good  Recall: Good  Fund of Knowledge: Good  Language: Good   Psychomotor Activity  Psychomotor Activity: Psychomotor Activity: Normal   Assets  Assets: Communication Skills; Desire for Improvement; Resilience   Sleep  Sleep: Sleep: Fair Number of Hours of Sleep: 5.75    Physical Exam: Physical Exam Vitals and nursing note reviewed.  Constitutional:      General: He is not in acute distress.    Appearance: Normal appearance. He is normal weight. He is not ill-appearing or toxic-appearing.  HENT:     Head: Normocephalic and atraumatic.  Pulmonary:     Effort: Pulmonary effort is normal.  Musculoskeletal:        General: Normal range of motion.  Neurological:     General: No focal deficit present.     Mental Status: He is alert.    Review of Systems  Respiratory:  Negative for cough and shortness of breath.   Cardiovascular:  Negative for chest pain.  Gastrointestinal:  Negative for abdominal pain, constipation, diarrhea, nausea and vomiting.  Neurological:  Negative for dizziness, weakness and headaches.  Psychiatric/Behavioral:  Positive for depression. Negative for hallucinations and suicidal ideas. The patient is nervous/anxious.    Blood pressure 106/70, pulse 79, temperature 98.2 F (36.8 C), temperature source Oral, resp. rate 16, height 5\' 11"  (1.803 m), weight 70.3 kg, SpO2 100%. Body mass index is 21.62 kg/m.   Treatment Plan Summary: Daily contact with patient to assess and evaluate symptoms and progress in treatment and Medication  management  Robert Harris is a 42 yr old male who presented on 11/27 to [facility] with worsening depression, grief, and SI, he was admitted to [facility] on 11/28.  PPHx is significant for Depression, Anxiety, PTSD, and Polysubstance Abuse (Meth, EtOH, Cocaine, Acid, Shrooms), and Prior Suicide Attempts (put gun in mouth and pulled trigger and walked in traffic), a remote history of Self Injurious Behavior (Burning with lighter- years ago), and no Prior Psychiatric Hospitalizations.      Clide Cliff has tolerated starting medications with some sedation from starting Seroquel.  If he continues to tolerate the Zoloft we will plan to increase his Zoloft tomorrow.  Social Work will discuss Residential Rehab options with him as he is interested in this once psychiatrically cleared.  We will not make any changes to his medications at this time.  We will continue to monitor.      MDD, Recurrent, Severe, w/out Psychosis  GAD  PTSD: -Continue Zoloft 25 mg daily for depression and anxiety -Continue Seroquel 50 mg QHS for augmentation, sleep, and appetite -Continue Agitation Protocol: Haldol/Ativan/Benadryl     Polysubstance Abuse: -Interested in Residential Rehab     -Continue PRN's: Tylenol, Maalox, Atarax, Milk of Magnesia, Trazodone    Labs: CMP: WNL, CBC: WNL, UDS: Cocaine and THC positive, EKG: NSR with Qtc: 418  11/29: A1c: 5.4,  Lipid Panel: WNL except LDL: 108,  TSH: 1.479  Lauro Franklin, MD 11/09/2023, 12:51 PM

## 2023-11-10 DIAGNOSIS — F332 Major depressive disorder, recurrent severe without psychotic features: Secondary | ICD-10-CM | POA: Diagnosis not present

## 2023-11-10 MED ORDER — SERTRALINE HCL 50 MG PO TABS
50.0000 mg | ORAL_TABLET | Freq: Every day | ORAL | Status: DC
  Administered 2023-11-11 – 2023-11-13 (×3): 50 mg via ORAL
  Filled 2023-11-10 (×4): qty 1

## 2023-11-10 MED ORDER — SERTRALINE HCL 25 MG PO TABS
25.0000 mg | ORAL_TABLET | Freq: Once | ORAL | Status: AC
  Administered 2023-11-10: 25 mg via ORAL

## 2023-11-10 NOTE — Group Note (Unsigned)
Date:  11/11/2023 Time:  12:19 AM  Group Topic/Focus:  Wrap-Up Group:   The focus of this group is to help patients review their daily goal of treatment and discuss progress on daily workbooks.    Participation Level:  Active  Participation Quality:  Appropriate and Sharing  Affect:  Appropriate  Cognitive:  Appropriate  Insight: Appropriate and Limited  Engagement in Group:  Engaged  Modes of Intervention:  Activity and Socialization  Additional Comments:  Patient stated that he is doing "good" and stated that he had a "good day"/ Patient stated that he interacted with others on the unit throughout the day. Patient was limited and did not share much. Patient rated his day a 8/10. Patient participated in activity after sharing.   [name] 11/11/2023, 12:19 AM

## 2023-11-10 NOTE — [facility] Group Notes (Signed)
[facility] Group Notes:  (Nursing/MHT/Case Management/Adjunct)  Date:  11/10/2023  Time:  2:37 PM  Type of Therapy:  Psychoeducational Skills  Participation Level:  Active  Participation Quality:  Appropriate  Affect:  Appropriate  Cognitive:  Alert and Appropriate  Insight:  Appropriate  Engagement in Group:  Engaged  Modes of Intervention:  Discussion, Education, and Exploration  Summary of Progress/Problems: Pts were educated on the impact of negative thinking, positive reframing and the power of mindfulness. Pts were allowed to discuss one negative thought/habit or coping skill they would like to change to impact their mental health. Pt attended and was appropriate.   Malva Limes 11/10/2023, 2:37 PM
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
## 2023-11-10 NOTE — Group Note (Signed)
LCSW Group Therapy Note  Group Date: 11/10/2023 Start Time: 1000 End Time: 1100   Type of Therapy and Topic:  Group Therapy - Healthy vs Unhealthy Coping Skills  Participation Level:  Active   Description of Group The focus of this group was to determine what unhealthy coping techniques typically are used by group members and what healthy coping techniques would be helpful in coping with various problems. Patients were guided in becoming aware of the differences between healthy and unhealthy coping techniques. Patients were asked to identify 2-3 healthy coping skills they would like to learn to use more effectively.  Therapeutic Goals Patients learned that coping is what human beings do all day long to deal with various situations in their lives Patients defined and discussed healthy vs unhealthy coping techniques Patients identified their preferred coping techniques and identified whether these were healthy or unhealthy Patients determined 2-3 healthy coping skills they would like to become more familiar with and use more often. Patients provided support and ideas to each other   Summary of Patient Progress:  During group, Robert Harris expressed smoking marijuana is an unhealthy coping skill he uses, while listening to music while working on his motorcycle is healthy. Patient proved open to input from peers and feedback from CSW. Patient demonstrated good insight into the subject matter, was respectful of peers, and participated throughout the entire session.   Therapeutic Modalities Cognitive Behavioral Therapy Motivational Interviewing  Burnard Bunting 11/10/2023  11:17 AM

## 2023-11-10 NOTE — Plan of Care (Signed)
  Problem: Education: Goal: Emotional status will improve Outcome: Not Progressing Goal: Mental status will improve Outcome: Not Progressing   

## 2023-11-10 NOTE — Progress Notes (Signed)
   11/10/23 2200  Psych Admission Type (Psych Patients Only)  Admission Status Voluntary  Psychosocial Assessment  Patient Complaints Depression  Eye Contact Brief  Facial Expression Flat  Affect Appropriate to circumstance;Depressed  Speech Logical/coherent  Interaction Minimal  Motor Activity Other (Comment) (wnl)  Appearance/Hygiene Unremarkable  Behavior Characteristics Cooperative;Appropriate to situation  Mood Pleasant;Depressed  Thought Process  Coherency WDL  Content WDL  Delusions None reported or observed  Perception WDL  Hallucination None reported or observed  Judgment Poor  Confusion None  Danger to Self  Current suicidal ideation? Denies  Agreement Not to Harm Self Yes  Description of Agreement verbally  Danger to Others  Danger to Others None reported or observed

## 2023-11-10 NOTE — Progress Notes (Signed)
   11/10/23 1700  Psych Admission Type (Psych Patients Only)  Admission Status Voluntary  Psychosocial Assessment  Patient Complaints Depression  Eye Contact Brief  Facial Expression Flat  Affect Flat;Depressed  Speech Logical/coherent  Interaction Minimal  Motor Activity Slow  Appearance/Hygiene Unremarkable  Behavior Characteristics Cooperative  Mood Depressed  Thought Process  Coherency WDL  Content WDL  Delusions WDL  Perception WDL  Hallucination None reported or observed  Judgment Impaired  Confusion None  Danger to Self  Current suicidal ideation? Denies  Danger to Others  Danger to Others None reported or observed

## 2023-11-10 NOTE — Progress Notes (Signed)
   11/09/23 2041  Psych Admission Type (Psych Patients Only)  Admission Status Voluntary  Psychosocial Assessment  Patient Complaints Depression;Sadness  Eye Contact Brief  Facial Expression Flat  Affect Flat;Depressed  Speech Logical/coherent  Interaction Minimal  Motor Activity Slow  Appearance/Hygiene Unremarkable  Behavior Characteristics Cooperative;Appropriate to situation  Mood Depressed  Thought Process  Coherency WDL  Content WDL  Delusions WDL  Perception WDL  Hallucination None reported or observed  Judgment Impaired  Confusion None  Danger to Self  Current suicidal ideation? Denies  Agreement Not to Harm Self Yes  Description of Agreement verbal  Danger to Others  Danger to Others None reported or observed

## 2023-11-11 DIAGNOSIS — F332 Major depressive disorder, recurrent severe without psychotic features: Secondary | ICD-10-CM

## 2023-11-11 NOTE — Plan of Care (Signed)
Nurse discussed anxiety, depression and hopeless with patient.  

## 2023-11-11 NOTE — [facility] Group Notes (Signed)
[facility] Group Notes:  (Nursing/MHT/Case Management/Adjunct)  Date:  11/11/2023  Time:  9:39 PM  Type of Therapy:  Psychoeducational Skills  Participation Level:  Minimal  Participation Quality:  Attentive  Affect:  Flat  Cognitive:  Appropriate  Insight:  Limited  Engagement in Group:  Limited  Modes of Intervention:  Education  Summary of Progress/Problems: Patient rated his day as a 5 out of 10 since he's had difficulty sleeping. He states that he laughed with his peers today.   Hazle Coca S 11/11/2023, 9:39 PM

## 2023-11-11 NOTE — Progress Notes (Signed)
   11/11/23 2155  Psych Admission Type (Psych Patients Only)  Admission Status Voluntary  Psychosocial Assessment  Patient Complaints Anxiety;Depression;Hopelessness;Worrying  Eye Contact Brief  Facial Expression Flat  Affect Appropriate to circumstance;Depressed  Speech Logical/coherent  Interaction Minimal  Motor Activity Other (Comment) (wnl)  Appearance/Hygiene Unremarkable  Behavior Characteristics Appropriate to situation;Cooperative  Mood Pleasant  Thought Process  Coherency WDL  Content WDL  Delusions None reported or observed  Perception WDL  Hallucination None reported or observed  Judgment Poor  Confusion None  Danger to Self  Current suicidal ideation? Denies  Agreement Not to Harm Self Yes  Description of Agreement verbally  Danger to Others  Danger to Others None reported or observed

## 2023-11-11 NOTE — Progress Notes (Signed)
[facility] MD Progress Note  11/11/2023 1:18 PM Robert Harris  MRN:  [id number] Subjective:   Robert Harris is a 42 yr old male who presented on 11/27 to [facility] with worsening depression, grief, and SI, he was admitted to [facility] on 11/28. PPHx is significant for Depression, Anxiety, PTSD, and Polysubstance Abuse (Meth, EtOH, Cocaine, Acid, Shrooms), and Prior Suicide Attempts (put gun in mouth and pulled trigger and walked in traffic), a remote history of Self Injurious Behavior (Burning with lighter- years ago), and no Prior Psychiatric Hospitalizations.    Case was discussed in the multidisciplinary team. MAR was reviewed and patient was compliant with medications.  He did not receive any PRN's yesterday.   Psychiatric Team made the following recommendations yesterday: -Increase Zoloft to 50 mg daily for depression and anxiety -Continue Seroquel 50 mg QHS for augmentation, sleep, and appetite    On interview today patient reports he slept good last night.  He reports his appetite is doing fair and that he ate his entire breakfast this morning.  He reports Passive SI is still present but it is improved from yesterday and lessening.  He reports no HI or AVH.  He reports no Paranoia or Ideas of Reference.  He reports no issues with his medications.  He reports he has noticed he has had some moments of happiness since starting the medication which is a big change from the last year or so.  Discussed with him that we would most likely further increase his Zoloft to continue treating his symptoms and he reported agreement.  Discussed with him that Social Work would continue to discuss potential Residential Rehab locations.  He reports no other concerns at present.    Principal Problem: MDD (major depressive disorder), recurrent severe, without psychosis (HCC) Diagnosis: Principal Problem:   MDD (major depressive disorder), recurrent severe, without psychosis (HCC) Active Problems:   GAD  (generalized anxiety disorder)   PTSD (post-traumatic stress disorder)   Polysubstance abuse (HCC)  Total Time spent with patient:  I personally spent 35 minutes on the unit in direct patient care. The direct patient care time included face-to-face time with the patient, reviewing the patient's chart, communicating with other professionals, and coordinating care. Greater than 50% of this time was spent in counseling or coordinating care with the patient regarding goals of hospitalization, psycho-education, and discharge planning needs.   Past Psychiatric History: Depression, Anxiety, PTSD, and Polysubstance Abuse (Meth, EtOH, Cocaine, Acid, Shrooms), and Prior Suicide Attempts (put gun in mouth and pulled trigger and walked in traffic), a remote history of Self Injurious Behavior (Burning with lighter- years ago), and no Prior Psychiatric Hospitalizations.   Past Medical History:  Past Medical History:  Diagnosis Date   Anxiety    Asthma     Past Surgical History:  Procedure Laterality Date   CLOSED REDUCTION METACARPAL WITH PERCUTANEOUS PINNING Left 02/12/2021   Procedure: open reduction and internal fixation of metacarpal left thumb;  Surgeon: Bradly Bienenstock, MD;  Location: [facility] OR;  Service: Orthopedics;  Laterality: Left;  with IV sedation   COLONOSCOPY     TONSILLECTOMY     WISDOM TOOTH EXTRACTION     Family History: History reviewed. No pertinent family history. Family Psychiatric  History:  Father- EtOH Abuse Mother- Cocaine Abuse Brother- Substance Abuse No Known Diagnosis' or Suicides   Social History:  Social History   Substance and Sexual Activity  Alcohol Use Yes   Alcohol/week: 6.0 standard drinks of alcohol   Types: 6 Cans  of beer per week   Comment: few times weekly     Social History   Substance and Sexual Activity  Drug Use Yes   Types: Marijuana, Cocaine, Methamphetamines    Social History   Socioeconomic History   Marital status: Single    Spouse name:  Not on file   Number of children: Not on file   Years of education: Not on file   Highest education level: Not on file  Occupational History   Not on file  Tobacco Use   Smoking status: Every Day    Current packs/day: 0.25    Average packs/day: 0.3 packs/day for 20.0 years (5.0 ttl pk-yrs)    Types: Cigarettes   Smokeless tobacco: Current    Types: Chew   Tobacco comments:    "every now and then"  Vaping Use   Vaping status: Every Day   Substances: Nicotine, THC  Substance and Sexual Activity   Alcohol use: Yes    Alcohol/week: 6.0 standard drinks of alcohol    Types: 6 Cans of beer per week    Comment: few times weekly   Drug use: Yes    Types: Marijuana, Cocaine, Methamphetamines   Sexual activity: Not Currently  Other Topics Concern   Not on file  Social History Narrative   Not on file   Social Determinants of Health   Financial Resource Strain: Not on file  Food Insecurity: Food Insecurity Present (11/07/2023)   Hunger Vital Sign    Worried About Running Out of Food in the Last Year: Often true    Ran Out of Food in the Last Year: Often true  Transportation Needs: Unmet Transportation Needs (11/07/2023)   PRAPARE - Administrator, Civil Service (Medical): Yes    Lack of Transportation (Non-Medical): Yes  Physical Activity: Not on file  Stress: Not on file  Social Connections: Unknown (01/22/2023)   Received from [facility]   Social Network    Social Network: Not on file   Additional Social History:                         Sleep: Good  Appetite:  Fair improving   Current Medications: Current Facility-Administered Medications  Medication Dose Route Frequency Provider Last Rate Last Admin   acetaminophen (TYLENOL) tablet 650 mg  650 mg Oral Q6H PRN Weber, Kyra A, NP       alum & mag hydroxide-simeth (MAALOX/MYLANTA) 200-200-20 MG/5ML suspension 30 mL  30 mL Oral Q4H PRN Weber, Kyra A, NP       haloperidol (HALDOL)  tablet 5 mg  5 mg Oral TID PRN Weber, Kyra A, NP       And   diphenhydrAMINE (BENADRYL) capsule 50 mg  50 mg Oral TID PRN Weber, Kyra A, NP       haloperidol lactate (HALDOL) injection 5 mg  5 mg Intramuscular TID PRN Weber, Kyra A, NP       And   diphenhydrAMINE (BENADRYL) injection 50 mg  50 mg Intramuscular TID PRN Weber, Kyra A, NP       And   LORazepam (ATIVAN) injection 2 mg  2 mg Intramuscular TID PRN Weber, Kyra A, NP       haloperidol lactate (HALDOL) injection 10 mg  10 mg Intramuscular TID PRN Weber, Kyra A, NP       And   diphenhydrAMINE (BENADRYL) injection 50 mg  50 mg Intramuscular TID PRN Weber,  Kyra A, NP       And   LORazepam (ATIVAN) injection 2 mg  2 mg Intramuscular TID PRN Weber, Kyra A, NP       magnesium hydroxide (MILK OF MAGNESIA) suspension 30 mL  30 mL Oral Daily PRN Weber, Kyra A, NP       QUEtiapine (SEROQUEL) tablet 50 mg  50 mg Oral QHS Lauro Franklin, MD   50 mg at 11/10/23 2121   sertraline (ZOLOFT) tablet 50 mg  50 mg Oral Daily Lauro Franklin, MD   50 mg at 11/11/23 0900   traZODone (DESYREL) tablet 50 mg  50 mg Oral QHS PRN Lauro Franklin, MD   50 mg at 11/09/23 2119    Lab Results:  No results found for this or any previous visit (from the past 48 hour(s)).   Blood Alcohol level:  Lab Results  Component Value Date   [facility]  07/27/2009    <5        LOWEST DETECTABLE LIMIT FOR SERUM ALCOHOL IS 5 mg/dL FOR MEDICAL PURPOSES ONLY    Metabolic Disorder Labs: Lab Results  Component Value Date   HGBA1C 5.4 11/09/2023   MPG 108.28 11/09/2023   No results found for: "PROLACTIN" Lab Results  Component Value Date   CHOL 185 11/09/2023   TRIG 109 11/09/2023   HDL 55 11/09/2023   CHOLHDL 3.4 11/09/2023   VLDL 22 11/09/2023   LDLCALC 108 (H) 11/09/2023    Physical Findings: AIMS:  , ,  ,  ,    CIWA:    COWS:     Musculoskeletal: Strength & Muscle Tone: within normal limits Gait & Station: normal Patient leans:  N/A  Psychiatric Specialty Exam:  Presentation  General Appearance:  Casual  Eye Contact: Fair  Speech: Clear and Coherent; Normal Rate  Speech Volume: Normal  Handedness: Right   Mood and Affect  Mood: Depressed  Affect: Congruent   Thought Process  Thought Processes: Coherent; Goal Directed  Descriptions of Associations:Intact  Orientation:Full (Time, Place and Person)  Thought Content:Logical; WDL  History of Schizophrenia/Schizoaffective disorder:No  Duration of Psychotic Symptoms:No data recorded Hallucinations:Hallucinations: None  Ideas of Reference:None  Suicidal Thoughts:Suicidal Thoughts: Yes, Passive (improving) SI Passive Intent and/or Plan: Without Intent; Without Plan  Homicidal Thoughts:Homicidal Thoughts: No   Sensorium  Memory: Immediate Good; Recent Good  Judgment: Fair  Insight: Fair   Executive Functions  Concentration: Good  Attention Span: Good  Recall: Good  Fund of Knowledge: Good  Language: Good   Psychomotor Activity  Psychomotor Activity: Psychomotor Activity: Normal   Assets  Assets: Communication Skills; Desire for Improvement; Resilience   Sleep  Sleep: Sleep: Good Number of Hours of Sleep: 6.75    Physical Exam: Physical Exam Vitals and nursing note reviewed.  Constitutional:      General: He is not in acute distress.    Appearance: Normal appearance. He is normal weight. He is not ill-appearing or toxic-appearing.  HENT:     Head: Normocephalic and atraumatic.  Pulmonary:     Effort: Pulmonary effort is normal.  Musculoskeletal:        General: Normal range of motion.  Neurological:     General: No focal deficit present.     Mental Status: He is alert.    Review of Systems  Respiratory:  Negative for cough and shortness of breath.   Cardiovascular:  Negative for chest pain.  Gastrointestinal:  Negative for abdominal pain, constipation, diarrhea, nausea and vomiting.  Neurological:  Negative for dizziness, weakness and headaches.  Psychiatric/Behavioral:  Positive for depression and suicidal ideas (Passive, Improved). Negative for hallucinations. The patient is not nervous/anxious.    Blood pressure 107/78, pulse 85, temperature 98.2 F (36.8 C), temperature source Oral, resp. rate 18, height 5\' 11"  (1.803 m), weight 70.3 kg, SpO2 99%. Body mass index is 21.62 kg/m.   Treatment Plan Summary: Daily contact with patient to assess and evaluate symptoms and progress in treatment and Medication management  Robert [facility] is a 42 yr old male who presented on 11/27 to [facility] with worsening depression, grief, and SI, he was admitted to [facility] on 11/28.  PPHx is significant for Depression, Anxiety, PTSD, and Polysubstance Abuse (Meth, EtOH, Cocaine, Acid, Shrooms), and Prior Suicide Attempts (put gun in mouth and pulled trigger and walked in traffic), a remote history of Self Injurious Behavior (Burning with lighter- years ago), and no Prior Psychiatric Hospitalizations.      Clide Cliff is showing improvement with the medications but continues to have significant depression from his trauma.  May need to consider further increasing his Zoloft tomorrow.  We will not make any changes to his medications at this time.  We will continue to monitor.     MDD, Recurrent, Severe, w/out Psychosis  GAD  PTSD: -Continue Zoloft 50 mg daily for depression and anxiety -Continue Seroquel 50 mg QHS for augmentation, sleep, and appetite -Continue Agitation Protocol: Haldol/Ativan/Benadryl     Polysubstance Abuse: -Interested in Residential Rehab     -Continue PRN's: Tylenol, Maalox, Atarax, Milk of Magnesia, Trazodone    Labs: CMP: WNL, CBC: WNL, UDS: Cocaine and THC positive, EKG: NSR with Qtc: 418  11/29: A1c: 5.4,  Lipid Panel: WNL except LDL: 108,  TSH: 1.479  Lauro Franklin, MD 11/11/2023, 1:18 PM

## 2023-11-11 NOTE — [facility] Counselor (Signed)
CSW met with patient to discussion residential treatment options post discharge. Patient was provided with comprehensive list of options in the surrounding area. Advised the patient to review options and begin calling tomorrow morning the places he would like to pursue. Explained that tomorrow he needs to update the social work team on his progress and they will continue to work with him on discharge housing/rehab options.   Jeramy Dimmick LCSWA

## 2023-11-11 NOTE — Progress Notes (Signed)
D:  Patient's self inventory sheet, patient has poor sleep, no sleep medication given.  Fair appetite, normal energy level, good concentration.  Rated depression 4, hopeless 2, anxiety 6.  Denied withdrawals.  Denied SI.  Physical problems, neck pain.  Worst pain #5 in past 24 hours.  Goal is make some calls to develop a plan for discharge.  A:  Medications administered per MD orders.  Emotional support and encouragement given patient. R:  Denied SI and HI, contracts for safety.  Denied A/V hallucinations.  Safety maintained with 15 minute checks.

## 2023-11-11 NOTE — Progress Notes (Signed)
Pt did not attend goals group. 

## 2023-11-11 NOTE — Plan of Care (Signed)
  Problem: Education: Goal: Mental status will improve Outcome: Progressing   Problem: Activity: Goal: Sleeping patterns will improve Outcome: Progressing   

## 2023-11-11 NOTE — [facility] Group Notes (Signed)
[facility] Group Notes:  (Nursing/MHT/Case Management/Adjunct)  Date:  11/11/2023  Time:  12:58 PM  Type of Therapy:  Psychoeducational Skills  Participation Level:  did not attend  Participation Quality  Affect:  Cognitive:    Insight:    Engagement in Group:    Modes of Intervention:    Summary of Progress/Problems:  Patients were given education on motivation with a podcast from '' [employer] '' in which the guest was DR. ALOK K who talked about dopamine, and tapping into the power you have to get where you want to go in life. ''  Pt did not attend.   Malva Limes 11/11/2023, 12:58 PM

## 2023-11-11 NOTE — Plan of Care (Signed)
  Problem: Education: Goal: Emotional status will improve Outcome: Progressing Goal: Mental status will improve Outcome: Progressing   

## 2023-11-12 MED ORDER — SENNA 8.6 MG PO TABS: 1.0000 | ORAL_TABLET | Freq: Every evening | ORAL | Status: DC | PRN

## 2023-11-12 MED ORDER — DIPHENHYDRAMINE HCL 50 MG/ML IJ SOLN: 25.0000 mg | Freq: Four times a day (QID) | INTRAMUSCULAR | Status: DC | PRN

## 2023-11-12 MED ORDER — ACETAMINOPHEN 325 MG PO TABS: 650.0000 mg | ORAL_TABLET | Freq: Four times a day (QID) | ORAL | Status: DC | PRN

## 2023-11-12 MED ORDER — BISMUTH SUBSALICYLATE 262 MG PO CHEW: 524.0000 mg | CHEWABLE_TABLET | ORAL | Status: DC | PRN

## 2023-11-12 MED ORDER — ALUM & MAG HYDROXIDE-SIMETH 200-200-20 MG/5ML PO SUSP: 30.0000 mL | ORAL | Status: DC | PRN

## 2023-11-12 MED ORDER — HALOPERIDOL 5 MG PO TABS: 5.0000 mg | ORAL_TABLET | Freq: Four times a day (QID) | ORAL | Status: DC | PRN

## 2023-11-12 MED ORDER — DIPHENHYDRAMINE HCL 25 MG PO CAPS: 25.0000 mg | ORAL_CAPSULE | Freq: Four times a day (QID) | ORAL | Status: DC | PRN

## 2023-11-12 MED ORDER — LORAZEPAM 2 MG/ML IJ SOLN: 1.0000 mg | Freq: Four times a day (QID) | INTRAMUSCULAR | Status: DC | PRN

## 2023-11-12 MED ORDER — HYDROXYZINE HCL 25 MG PO TABS: 25.0000 mg | ORAL_TABLET | Freq: Three times a day (TID) | ORAL | Status: DC | PRN

## 2023-11-12 MED ORDER — ONDANSETRON HCL 4 MG PO TABS: 8.0000 mg | ORAL_TABLET | Freq: Three times a day (TID) | ORAL | Status: DC | PRN

## 2023-11-12 MED ORDER — HALOPERIDOL LACTATE 5 MG/ML IJ SOLN: 5.0000 mg | Freq: Four times a day (QID) | INTRAMUSCULAR | Status: DC | PRN

## 2023-11-12 MED ORDER — LORAZEPAM 1 MG PO TABS: 1.0000 mg | ORAL_TABLET | Freq: Four times a day (QID) | ORAL | Status: DC | PRN

## 2023-11-12 MED ORDER — POLYETHYLENE GLYCOL 3350 17 G PO PACK: 17.0000 g | PACK | Freq: Every day | ORAL | Status: DC | PRN

## 2023-11-12 NOTE — Progress Notes (Signed)
LCSW attempted to follow up with patient regarding updates, however patient was on the phone. LCSW will follow up at a later time to discuss disposition plans.   Fernande Boyden, LCSW Clinical Social Worker [facility]

## 2023-11-12 NOTE — [facility] Group Notes (Signed)
Spiritual care group on grief and loss facilitated by Chaplain Dyanne Carrel, [facility]  Group Goal: Support / Education around grief and loss  Members engage in facilitated group support and psycho-social education.  Group Description:  Following introductions and group rules, group members engaged in facilitated group dialogue and support around topic of loss, with particular support around experiences of loss in their lives. Group Identified types of loss (relationships / self / things) and identified patterns, circumstances, and changes that precipitate losses. Reflected on thoughts / feelings around loss, normalized grief responses, and recognized variety in grief experience. Group encouraged individual reflection on safe space and on the coping skills that they are already utilizing.  Group drew on Adlerian / Rogerian and narrative framework  Patient Progress: Robert [facility] attended group and actively engaged and participated in group conversation and activities.

## 2023-11-12 NOTE — Group Note (Signed)
Date:  11/12/2023 Time:  8:09 PM  Group Topic/Focus:  Alcoholics Anonymous Meeting    Participation Level:  Active  Participation Quality:  Appropriate  Affect:  Appropriate  Cognitive:  Appropriate  Insight: Appropriate  Engagement in Group:  Engaged  Modes of Intervention:  Discussion and Support  Additional Comments:  Patient attended AA.  [facility] 11/12/2023, 8:09 PM

## 2023-11-12 NOTE — Progress Notes (Signed)
Patient presents with flat affect and stated he slept poorly last night. Patient denies SI,HI, and A/V/H with no plan or intent. Patient states his depression is a 2 out of 10 and anxiety a 6 out of 10. Patient is med compliant and cooperative in unit.

## 2023-11-12 NOTE — Plan of Care (Signed)
  Problem: Coping: Goal: Ability to verbalize frustrations and anger appropriately will improve Outcome: Progressing Goal: Ability to demonstrate self-control will improve Outcome: Progressing   Problem: Health Behavior/Discharge Planning: Goal: Compliance with treatment plan for underlying cause of condition will improve Outcome: Progressing   Problem: Safety: Goal: Periods of time without injury will increase Outcome: Progressing

## 2023-11-12 NOTE — Progress Notes (Signed)
[facility] MD Progress Note  11/12/2023 10:00 AM Robert Harris  MRN:  [id number]  Principal Problem: MDD (major depressive disorder), recurrent severe, without psychosis (HCC) Diagnosis: Principal Problem:   MDD (major depressive disorder), recurrent severe, without psychosis (HCC) Active Problems:   GAD (generalized anxiety disorder)   PTSD (post-traumatic stress disorder)   Polysubstance abuse (HCC)  Reason for Admission:  Robert Harris is a 42 yr old male who presented on 11/27 to [facility] with worsening depression, grief, and SI, he was admitted to [facility] on 11/28. PPHx is significant for Depression, Anxiety, PTSD, and Polysubstance Abuse (Meth, EtOH, Cocaine, Acid, Shrooms), and Prior Suicide Attempts (put gun in mouth and pulled trigger and walked in traffic), a remote history of Self Injurious Behavior (Burning with lighter- years ago), and no Prior Psychiatric Hospitalizations (admitted on 11/07/2023, total  LOS: 5 days )  Chart Review from last 24 hours:  The patient's chart was reviewed and nursing notes were reviewed. The patient's case was discussed in multidisciplinary team meeting.   - Overnight events to report per chart review / staff report: no notable overnight events to report - Patient received all scheduled medications - Patient received the following PRN medications: trazodone  Information Obtained Today During Patient Interview: The patient was seen and evaluated on the unit. On assessment today the patient reports not feeling depressed or anxious. He says he feels "a little high." When asked what he means by this, he says, "there's just not a lot to do here and I am someone who likes to be active." He denies cravings for any substances.   He reports attending group sessions and finds them useful.  Patient endorses poor sleep because of nightmares related to past trauma; endorses fair appetite ("I have a bowel movement every day").  Patient does not endorse any  side-effects they attribute to medications. He asked me for a list of his current medications to provide to the rehab facility he was speaking to.  Past Psychiatric History: Depression, Anxiety, PTSD, and Polysubstance Abuse (Meth, EtOH, Cocaine, Acid, Shrooms), and Prior Suicide Attempts (put gun in mouth and pulled trigger and walked in traffic), a remote history of Self Injurious Behavior (Burning with lighter- years ago), and no Prior Psychiatric Hospitalizations.   Past Medical History:  Past Medical History:  Diagnosis Date   Anxiety    Asthma    Family Psychiatric  History:  Father- EtOH Abuse Mother- Cocaine Abuse Brother- Substance Abuse No Known Diagnosis' or Suicides Social History:  Marital status: Single Are you sexually active?: No (Patient states "no, not really".) What is your sexual orientation?: Patient states "straight male". Has your sexual activity been affected by drugs, alcohol, medication, or emotional stress?: Patient states no. Does patient have children?: Yes How many children?: 1 How is patient's relationship with their children?: "spotty"  Current Medications: Current Facility-Administered Medications  Medication Dose Route Frequency Provider Last Rate Last Admin   acetaminophen (TYLENOL) tablet 650 mg  650 mg Oral Q6H PRN Augusto Gamble, MD       alum & mag hydroxide-simeth (MAALOX/MYLANTA) 200-200-20 MG/5ML suspension 30 mL  30 mL Oral Q4H PRN Augusto Gamble, MD       bismuth subsalicylate (PEPTO BISMOL) chewable tablet 524 mg  524 mg Oral Q3H PRN Augusto Gamble, MD       haloperidol (HALDOL) tablet 5 mg  5 mg Oral Q6H PRN Augusto Gamble, MD       And   LORazepam (ATIVAN) tablet 1 mg  1 mg Oral Q6H PRN Augusto Gamble, MD       And   diphenhydrAMINE (BENADRYL) capsule 25 mg  25 mg Oral Q6H PRN Augusto Gamble, MD       haloperidol lactate (HALDOL) injection 5 mg  5 mg Intramuscular Q6H PRN Augusto Gamble, MD       And   LORazepam (ATIVAN) injection 1 mg  1 mg  Intramuscular Q6H PRN Augusto Gamble, MD       And   diphenhydrAMINE (BENADRYL) injection 25 mg  25 mg Intramuscular Q6H PRN Augusto Gamble, MD       hydrOXYzine (ATARAX) tablet 25 mg  25 mg Oral TID PRN Augusto Gamble, MD       ondansetron [facility]) tablet 8 mg  8 mg Oral Q8H PRN Augusto Gamble, MD       polyethylene glycol (MIRALAX / GLYCOLAX) packet 17 g  17 g Oral Daily PRN Augusto Gamble, MD       QUEtiapine (SEROQUEL) tablet 50 mg  50 mg Oral QHS Lauro Franklin, MD   50 mg at 11/11/23 2104   senna (SENOKOT) tablet 8.6 mg  1 tablet Oral QHS PRN Augusto Gamble, MD       sertraline (ZOLOFT) tablet 50 mg  50 mg Oral Daily Lauro Franklin, MD   50 mg at 11/12/23 1610   traZODone (DESYREL) tablet 50 mg  50 mg Oral QHS PRN Lauro Franklin, MD   50 mg at 11/11/23 2104    Lab Results: No results found for this or any previous visit (from the past 48 hour(s)).  Blood Alcohol level:  Lab Results  Component Value Date   [facility]  07/27/2009    <5        LOWEST DETECTABLE LIMIT FOR SERUM ALCOHOL IS 5 mg/dL FOR MEDICAL PURPOSES ONLY    Metabolic Labs: Lab Results  Component Value Date   HGBA1C 5.4 11/09/2023   MPG 108.28 11/09/2023   No results found for: "PROLACTIN" Lab Results  Component Value Date   CHOL 185 11/09/2023   TRIG 109 11/09/2023   HDL 55 11/09/2023   CHOLHDL 3.4 11/09/2023   VLDL 22 11/09/2023   LDLCALC 108 (H) 11/09/2023    Physical Findings: AIMS: No  CIWA:    COWS:     Psychiatric Specialty Exam: General Appearance:  Casual   Eye Contact:  Good   Speech:  Clear and Coherent   Volume:  Normal   Mood:  -- ("I feel fine")   Affect:  Appropriate; Congruent; Full Range   Thought Content:  WDL   Suicidal Thoughts:  Suicidal Thoughts: No SI Passive Intent and/or Plan: Without Intent   Homicidal Thoughts:  Homicidal Thoughts: No   Thought Process:  Coherent; Goal Directed; Linear   Orientation:  Full (Time, Place and Person)     Memory:   Immediate Good; Recent Good; Remote Good   Judgment:  Good   Insight:  Good   Concentration:  Good   Recall:  Good   Fund of Knowledge:  Good   Language:  Good   Psychomotor Activity:  Psychomotor Activity: Normal   Assets:  Resilience   Sleep:  Sleep: Poor    Review of Systems Review of Systems  Constitutional: Negative.   Respiratory: Negative.    Cardiovascular: Negative.   Gastrointestinal: Negative.   Genitourinary: Negative.   Psychiatric/Behavioral:         Psychiatric subjective data addressed in PSE or HPI / daily subjective  report    Vital Signs: Blood pressure 98/75, pulse 85, temperature 98 F (36.7 C), temperature source Oral, resp. rate 16, height 5\' 11"  (1.803 m), weight 70.3 kg, SpO2 100%. Body mass index is 21.62 kg/m. Physical Exam Vitals and nursing note reviewed.  HENT:     Head: Normocephalic and atraumatic.  Pulmonary:     Effort: Pulmonary effort is normal.  Musculoskeletal:     Cervical back: Normal range of motion.  Neurological:     General: No focal deficit present.     Mental Status: He is alert.     Assets  Assets: Resilience   Treatment Plan Summary: Daily contact with patient to assess and evaluate symptoms and progress in treatment and Medication management  Diagnoses / Active Problems: MDD (major depressive disorder), recurrent severe, without psychosis (HCC) Principal Problem:   MDD (major depressive disorder), recurrent severe, without psychosis (HCC) Active Problems:   GAD (generalized anxiety disorder)   PTSD (post-traumatic stress disorder)   Polysubstance abuse (HCC)   ASSESSMENT: Robert Harris is a 42 yr old male who presented on 11/27 to [facility] with worsening depression, grief, and SI, he was admitted to [facility] on 11/28. PPHx is significant for Depression, Anxiety, PTSD, and Polysubstance Abuse (Meth, EtOH, Cocaine, Acid, Shrooms), and Prior Suicide Attempts (put gun in mouth and pulled  trigger and walked in traffic), a remote history of Self Injurious Behavior (Burning with lighter- years ago), and no Prior Psychiatric Hospitalizations  PLAN: Safety and Monitoring:  -- Voluntary admission to inpatient psychiatric unit for safety, stabilization and treatment  -- Daily contact with patient to assess and evaluate symptoms and progress in treatment  -- Patient's case to be discussed in multi-disciplinary team meeting  -- Observation Level : q15 minute checks  -- Vital signs:  q12 hours  -- Precautions: suicide, elopement, and assault  2. Interventions (medications, psychoeducation, etc):  -- continue sertraline 50 mg daily for depressive symptoms  -- continue quetiapine 50 mg daily at bedtime for insomnia  -- can consider prazosin daily at bedtime for nightmares associated with past trauma if BP improves  -- Patient does not need nicotine replacement  PRN medications for symptomatic management:              -- continue acetaminophen 650 mg every 6 hours as needed for mild to moderate pain, fever, and headaches              -- continue hydroxyzine 25 mg three times a day as needed for anxiety              -- continue bismuth subsalicylate 524 mg oral chewable tablet every 3 hours as needed for indigestion              -- continue senna 8.6 mg oral at bedtime as needed and polyethylene glycol 17 g oral daily as needed for mild to moderate constipation              -- continue ondansetron 8 mg every 8 hours as needed for nausea or vomiting              -- continue aluminum-magnesium hydroxide + simethicone 30 mL every 4 hours as needed for heartburn              -- continue trazodone 50 mg at bedtime as needed for insomnia  -- As needed agitation protocol in-place  The risks/benefits/side-effects/alternatives to the above medication were discussed in detail with the patient and  time was given for questions. The patient consents to medication trial. FDA black box warnings, if  present, were discussed.  The patient is agreeable with the medication plan, as above. We will monitor the patient's response to pharmacologic treatment, and adjust medications as necessary.  3. Routine and other pertinent labs:             -- Metabolic profile:  BMI: Body mass index is 21.62 kg/m.  Prolactin: No results found for: "PROLACTIN"  Lipid Panel: Lab Results  Component Value Date   CHOL 185 11/09/2023   TRIG 109 11/09/2023   HDL 55 11/09/2023   CHOLHDL 3.4 11/09/2023   VLDL 22 11/09/2023   LDLCALC 108 (H) 11/09/2023    HbgA1c: Hgb A1c MFr Bld (%)  Date Value  11/09/2023 5.4    TSH: TSH (uIU/mL)  Date Value  11/09/2023 1.479    EKG monitoring: QTc: 418 on 11/07/2023  4. Group Therapy:  -- Encouraged patient to participate in unit milieu and in scheduled group therapies   -- Short Term Goals: Ability to identify changes in lifestyle to reduce recurrence of condition, verbalize feelings, identify and develop effective coping behaviors, maintain clinical measurements within normal limits, and identify triggers associated with substance abuse/mental health issues will improve. Improvement in ability to disclose and discuss suicidal ideas, demonstrate self-control, and comply with prescribed medications.  -- Long Term Goals: Improvement in symptoms so as ready for discharge -- Patient is encouraged to participate in group therapy while admitted to the psychiatric unit. -- We will address other chronic and acute stressors, which contributed to the patient's MDD (major depressive disorder), recurrent severe, without psychosis (HCC) in order to reduce the risk of self-harm at discharge.  5. Discharge Planning:   -- Social work and case management to assist with discharge planning and identification of hospital follow-up needs prior to discharge  -- Estimated LOS: 2 days  -- Discharge Concerns: Need to establish a safety plan; Medication compliance and  effectiveness  -- Discharge Goals: Return home with outpatient referrals for mental health follow-up including medication management/psychotherapy  I certify that inpatient services furnished can reasonably be expected to improve the patient's condition.   Signed: Augusto Gamble, MD 11/12/2023, 10:00 AM

## 2023-11-12 NOTE — Discharge Planning (Signed)
LCSW contacted Admissions Coordinator Molly Maduro: [phone] at [facility] [place] in [place] regarding patient referral. Per Molly Maduro, patient has been accepted and can admit to their facility on tomorrow 11/13/2023 before 6:00pm. Address the patient is to report to is: [address] [place], [place] [address]. Patient will be provided transportation via [facility] to address. Patient and MD aware of plan. No other needs to report at this time. LCSW will continue to follow and provide support to patient while on unit.   Fernande Boyden, LCSW Clinical Social Worker [facility]

## 2023-11-12 NOTE — Discharge Planning (Signed)
LCSW went and spoke with patient in hallway to discuss disposition plans. Patient reports he has been making phone calls to the local facilities regarding bed availability and has been reserved a bed for the [facility]. Patient reports he was advised to follow up with the two following numbers, however reports he has not received an answer. Robert: [phone] and Tinnie Gens [phone]. LCSW attempted to call the numbers provided as well as received no answer. Voice message left requesting phone call back. LCSW also attempted to get in contact with SLA Director Richardson Dopp for the [place] location [phone] however, received no answer. LCSW will follow up at a later time and will provide updates as received.   Fernande Boyden, LCSW Clinical Social Worker [facility]

## 2023-11-12 NOTE — Group Note (Signed)
Recreation Therapy Group Note   Group Topic:Healthy Decision Making  Group Date: 11/12/2023 Start Time: 0935 End Time: 1010 Facilitators: Tyreik Delahoussaye-McCall, LRT,CTRS Location: [address] Dayroom   Group Topic: Decision Making, Problem Solving, Communication  Goal Area(s) Addresses:  Patient will effectively work with peer towards shared goal.  Patient will identify factors that guided their decision making.  Patient will pro-socially communicate ideas during group session.   Intervention: Survival Scenario - pencil, paper  Group Description: Patients were given a scenario that they were going to be stranded on a deserted [place] for several months before being rescued. Writer tasked them with making a list of 15 things they would choose to bring with them for "survival". The list of items was prioritized most important to least. Each patient would come up with their own list, then work together to create a new list of 15 items while in a group of 3-5 peers. LRT discussed each person's list and how it differed from others. The debrief included discussion of priorities, good decisions versus bad decisions, and how it is important to think before acting so we can make the best decision possible. LRT tied the concept of effective communication among group members to patient's support systems outside of the hospital and its benefit post discharge.  Education: Pharmacist, community, Priorities, Support System, Discharge Planning   Education Outcome: Acknowledges education/In group clarification/Needs additional education   Affect/Mood: Appropriate   Participation Level: Engaged   Participation Quality: Independent   Behavior: Appropriate   Speech/Thought Process: Focused   Insight: Good   Judgement: Good   Modes of Intervention: Activity   Patient Response to Interventions:  Engaged   Education Outcome:  In group clarification offered    Clinical Observations/Individualized  Feedback: Pt was engaged and social during group session. Pt was intent and focused on filling out his list. Pt identified some items such as sleeping bag, clothes, shoes, water, lighter, machete, bug repellent, dogs, dog food, etc. Pt worked well with peers in combing the lists to create one cohesive list.    Plan: Continue to engage patient in RT group sessions 2-3x/week.   Leafy Motsinger-McCall, LRT,CTRS  11/12/2023 12:20 PM

## 2023-11-13 MED ORDER — QUETIAPINE FUMARATE 50 MG PO TABS: 50.0000 mg | ORAL_TABLET | Freq: Every day | ORAL | 0 refills | Status: DC

## 2023-11-13 MED ORDER — TRAZODONE HCL 50 MG PO TABS: 50.0000 mg | ORAL_TABLET | Freq: Every evening | ORAL | 0 refills | Status: DC | PRN

## 2023-11-13 MED ORDER — SERTRALINE HCL 50 MG PO TABS: 50.0000 mg | ORAL_TABLET | Freq: Every day | ORAL | 0 refills | Status: DC

## 2023-11-13 NOTE — Group Note (Signed)
Recreation Therapy Group Note   Group Topic:Animal Assisted Therapy   Group Date: 11/13/2023 Start Time: 6962 End Time: 1033 Facilitators: Sammy Douthitt-McCall, LRT,CTRS Location: [address] Dayroom   Animal-Assisted Activity (AAA) Program Checklist/Progress Notes Patient Eligibility Criteria Checklist & Daily Group note for Rec Tx Intervention  AAA/T Program Assumption of Risk Form signed by Patient/ or Parent Legal Guardian Yes  Patient is free of allergies or severe asthma Yes  Patient reports no fear of animals Yes  Patient reports no history of cruelty to animals Yes  Patient understands his/her participation is voluntary Yes  Patient washes hands before animal contact Yes  Patient washes hands after animal contact Yes  Education: Hand Washing, Appropriate Animal Interaction   Education Outcome: Acknowledges education.    Affect/Mood: Appropriate   Participation Level: Engaged   Participation Quality: Independent   Behavior: Appropriate   Speech/Thought Process: Focused   Insight: Good   Judgement: Good   Modes of Intervention: Teaching laboratory technician   Patient Response to Interventions:  Engaged   Education Outcome:  In group clarification offered    Clinical Observations/Individualized Feedback: Patient attended session and interacted appropriately with therapy dog and peers. Patient asked appropriate questions about therapy dog and his training. Patient shared stories about their pets at home with group.    Plan: Continue to engage patient in RT group sessions 2-3x/week.   Harmoney Sienkiewicz-McCall, LRT,CTRS 11/13/2023 12:28 PM

## 2023-11-13 NOTE — Progress Notes (Signed)
   11/13/23 0900  Psych Admission Type (Psych Patients Only)  Admission Status Voluntary  Psychosocial Assessment  Patient Complaints None  Eye Contact Fair  Facial Expression Flat  Affect Appropriate to circumstance  Speech Logical/coherent  Interaction Assertive  Motor Activity Other (Comment) (WDL)  Appearance/Hygiene Unremarkable  Behavior Characteristics Cooperative;Appropriate to situation  Mood Pleasant  Thought Process  Coherency WDL  Content WDL  Delusions None reported or observed  Perception WDL  Hallucination None reported or observed  Judgment Impaired  Confusion None  Danger to Self  Current suicidal ideation? Denies  Agreement Not to Harm Self Yes  Description of Agreement Verbal  Danger to Others  Danger to Others None reported or observed

## 2023-11-13 NOTE — Progress Notes (Signed)
   11/13/23 0009  Psych Admission Type (Psych Patients Only)  Admission Status Voluntary  Psychosocial Assessment  Patient Complaints Anxiety  Eye Contact Fair  Facial Expression Flat  Affect Appropriate to circumstance  Speech Logical/coherent  Interaction Assertive  Motor Activity Other (Comment) (WDL)  Appearance/Hygiene Unremarkable  Behavior Characteristics Cooperative  Mood Pleasant  Thought Process  Coherency WDL  Content WDL  Delusions None reported or observed  Perception WDL  Hallucination None reported or observed  Judgment Poor  Confusion None  Danger to Self  Current suicidal ideation? Denies  Agreement Not to Harm Self Yes  Description of Agreement verbal contract for safety  Danger to Others  Danger to Others None reported or observed

## 2023-11-13 NOTE — Discharge Summary (Addendum)
Physician Discharge Summary Note Patient:  Robert Harris is an 42 y.o., male MRN:  [id number] DOB:  [birth year] Patient phone:  [phone] (home)  Patient address:   [address] [place] [address],  Total Time spent with patient: 45 minutes  Date of Admission:  11/07/2023 Date of Discharge: 11/13/2023  Reason for Admission:  Robert Harris is a 42 yr old male who presented on 11/27 to [facility] with worsening depression, grief, and SI, he was admitted to [facility] on 11/28. PPHx is significant for Depression, Anxiety, PTSD, and Polysubstance Abuse (Meth, EtOH, Cocaine, Acid, Shrooms), and Prior Suicide Attempts (put gun in mouth and pulled trigger and walked in traffic), a remote history of Self Injurious Behavior (Burning with lighter- years ago), and no Prior Psychiatric Hospitalizations   Principal Problem: MDD (major depressive disorder), recurrent severe, without psychosis (HCC) Discharge Diagnoses: Principal Problem:   MDD (major depressive disorder), recurrent severe, without psychosis (HCC) Active Problems:   GAD (generalized anxiety disorder)   PTSD (post-traumatic stress disorder)   Polysubstance abuse (HCC)   Past Psychiatric History: Depression, Anxiety, PTSD, and Polysubstance Abuse (Meth, EtOH, Cocaine, Acid, Shrooms), and Prior Suicide Attempts (put gun in mouth and pulled trigger and walked in traffic), a remote history of Self Injurious Behavior (Burning with lighter- years ago), and no Prior Psychiatric Hospitalizations.   Past Medical History:  Past Medical History:  Diagnosis Date   Anxiety    Asthma     Past Surgical History:  Procedure Laterality Date   CLOSED REDUCTION METACARPAL WITH PERCUTANEOUS PINNING Left 02/12/2021   Procedure: open reduction and internal fixation of metacarpal left thumb;  Surgeon: Bradly Bienenstock, MD;  Location: [facility] OR;  Service: Orthopedics;  Laterality: Left;  with IV sedation   COLONOSCOPY     TONSILLECTOMY     WISDOM TOOTH  EXTRACTION      Family Psychiatric  History:  Father- EtOH Abuse Mother- Cocaine Abuse Brother- Substance Abuse No Known Diagnosis' or Suicides Social History:  Marital status: Single Are you sexually active?: No (Patient states "no, not really".) What is your sexual orientation?: Patient states "straight male". Has your sexual activity been affected by drugs, alcohol, medication, or emotional stress?: Patient states no. Does patient have children?: Yes How many children?: 1 How is patient's relationship with their children?: "spotty"  Hospital Course:   During the patient's hospitalization, patient had extensive initial psychiatric evaluation, and follow-up psychiatric evaluations every day.  The following medications were managed: Scheduled Meds:  QUEtiapine  50 mg Oral QHS   sertraline  50 mg Oral Daily   PRN Meds:.acetaminophen, alum & mag hydroxide-simeth, bismuth subsalicylate, haloperidol **AND** LORazepam **AND** diphenhydrAMINE, haloperidol lactate **AND** LORazepam **AND** diphenhydrAMINE, hydrOXYzine, ondansetron, polyethylene glycol, senna, traZODone  Gradually, patient started adjusting to milieu. The patient was evaluated each day by a clinical provider to ascertain response to treatment. Improvement was noted by the patient's report of decreasing symptoms, improved sleep and appetite, affect, medication tolerance, behavior, and participation in unit programming.  Patient was asked each day to complete a self inventory noting mood, mental status, pain, new symptoms, anxiety and concerns.   Symptoms were reported as significantly decreased or resolved completely by discharge.  The patient reports that their mood is stable.  The patient denied having suicidal thoughts for more than 48 hours prior to discharge.  Patient denies having homicidal thoughts.  Patient denies having auditory hallucinations.  Patient denies any visual hallucinations or other symptoms of psychosis.   The patient was motivated  to continue taking medication with a goal of continued improvement in mental health.   Symptoms were reported as significantly decreased or resolved completely by discharge.   On day of discharge, the patient reports that their mood is stable. The patient denied having suicidal thoughts for more than 48 hours prior to discharge.  Patient denies having homicidal thoughts.  Patient denies having auditory hallucinations.  Patient denies any visual hallucinations or other symptoms of psychosis. The patient was motivated to continue taking medication with a goal of continued improvement in mental health.   The patient reports their target psychiatric symptoms of suicidal ideations and depressed mood responded well to the psychiatric medications, and the patient reports overall benefit from psychiatric hospitalization. Supportive psychotherapy was provided to the patient. The patient also participated in regular group therapy while hospitalized. Coping skills, problem solving as well as relaxation therapies were also part of the unit programming.  Labs were reviewed with the patient, and abnormal results were discussed with the patient.  The patient is able to verbalize their individual safety plan to this provider.  # It is recommended to the patient to continue psychiatric medications as prescribed, after discharge from the hospital.    # It is recommended to the patient to follow up with your outpatient psychiatric provider and PCP.  # It was discussed with the patient, the impact of alcohol, drugs, tobacco have been there overall psychiatric and medical wellbeing, and total abstinence from substance use was recommended.  # Prescriptions provided or sent directly to preferred pharmacy at discharge. Patient agreeable to plan. Given opportunity to ask questions. Appears to feel comfortable with discharge.    # In the event of worsening symptoms, the patient is instructed to  call the crisis hotline, 911 and or go to the nearest ED for appropriate evaluation and treatment of symptoms. To follow-up with primary care provider for other medical issues, concerns and or health care needs  # Patient was discharged  to [facility]  with a plan to follow up as noted below.  On day of discharge patient denying suicidal or homicidal ideations  Physical Findings: AIMS:  , ,  ,  ,    CIWA:    COWS:     Musculoskeletal: Strength & Muscle Tone: within normal limits Gait & Station: normal Patient leans: N/A  Psychiatric Specialty Exam  Presentation  General Appearance: Casual  Eye Contact:Good  Speech:Clear and Coherent  Speech Volume:Normal  Handedness:Right   Mood and Affect  Mood:-- ("I feel good")  Duration of Depression Symptoms: Greater than two weeks  Affect:Appropriate; Congruent; Full Range   Thought Process  Thought Processes:Coherent; Goal Directed; Linear  Descriptions of Associations:Intact  Orientation:Full (Time, Place and Person)  Thought Content:WDL  History of Schizophrenia/Schizoaffective disorder:No  Duration of Psychotic Symptoms:No data recorded Hallucinations:Hallucinations: None  Ideas of Reference:None  Suicidal Thoughts:Suicidal Thoughts: No SI Passive Intent and/or Plan: Without Intent  Homicidal Thoughts:Homicidal Thoughts: No   Sensorium  Memory:Immediate Good; Recent Good; Remote Good  Judgment:Good  Insight:Good   Executive Functions  Concentration:Good  Attention Span:Good  Recall:Good  Fund of Knowledge:Good  Language:Good   Psychomotor Activity  Psychomotor Activity:Psychomotor Activity: Normal   Assets  Assets:Resilience   Sleep  Sleep:Sleep: Fair   Physical Exam: Physical Exam Vitals and nursing note reviewed.  HENT:     Head: Normocephalic and atraumatic.  Pulmonary:     Effort: Pulmonary effort is normal.  Musculoskeletal:     Cervical back: Normal range of motion.  Neurological:     General: No focal deficit present.     Mental Status: He is alert.    Review of Systems  Constitutional: Negative.   Respiratory: Negative.    Cardiovascular: Negative.   Gastrointestinal: Negative.   Genitourinary: Negative.   Musculoskeletal:  Positive for neck pain.  Psychiatric/Behavioral:         Psychiatric subjective data addressed in PSE or HPI / daily subjective report   Blood pressure 103/76, pulse 87, temperature 97.9 F (36.6 C), temperature source Oral, resp. rate 16, height 5\' 11"  (1.803 m), weight 70.3 kg, SpO2 99%. Body mass index is 21.62 kg/m.  Social History   Tobacco Use  Smoking Status Every Day   Current packs/day: 0.25   Average packs/day: 0.3 packs/day for 20.0 years (5.0 ttl pk-yrs)   Types: Cigarettes  Smokeless Tobacco Current   Types: Chew  Tobacco Comments   "every now and then"   Tobacco Cessation:  N/A, patient does not currently use tobacco products  Blood Alcohol level:  Lab Results  Component Value Date   [facility]  07/27/2009    <5        LOWEST DETECTABLE LIMIT FOR SERUM ALCOHOL IS 5 mg/dL FOR MEDICAL PURPOSES ONLY    Metabolic Disorder Labs:  Lab Results  Component Value Date   HGBA1C 5.4 11/09/2023   MPG 108.28 11/09/2023   No results found for: "PROLACTIN" Lab Results  Component Value Date   CHOL 185 11/09/2023   TRIG 109 11/09/2023   HDL 55 11/09/2023   CHOLHDL 3.4 11/09/2023   VLDL 22 11/09/2023   LDLCALC 108 (H) 11/09/2023    See Psychiatric Specialty Exam and Suicide Risk Assessment completed by Attending Physician prior to discharge.  Discharge destination:  to [facility]  Is patient on multiple antipsychotic therapies at discharge:  No   Has Patient had three or more failed trials of antipsychotic monotherapy by history:  No  Recommended Plan for Multiple Antipsychotic Therapies: NA  Discharge Instructions     Activity as tolerated - No restrictions   Complete by: As directed    Diet - low  sodium heart healthy   Complete by: As directed       Allergies as of 11/13/2023   No Known Allergies      Medication List     TAKE these medications      Indication  albuterol 108 (90 Base) MCG/ACT inhaler Commonly known as: VENTOLIN HFA Inhale 2 puffs into the lungs every 6 (six) hours as needed for wheezing or shortness of breath.  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  Indication: Trouble Sleeping   sertraline 50 MG tablet Commonly known as: ZOLOFT Take 1 tablet (50 mg total) by mouth daily. Start taking on: November 14, 2023  Indication: Major Depressive Disorder   traZODone 50 MG tablet Commonly known as: DESYREL Take 1 tablet (50 mg total) by mouth at bedtime as needed for sleep.  Indication: Trouble Sleeping        Follow-up Information     [facility], [facility]. Go to.   Why: Please go to this provider for group therapy and medication management services. Contact information: [address] [place] [address] [phone]                 Follow-up recommendations / Comments: Activity:  as tolerated  Diet: heart healthy  Other: -Follow-up with your outpatient psychiatric provider -instructions on appointment date, time, and address (location) are provided to you in discharge paperwork.  -Take your psychiatric medications as prescribed at discharge - instructions are provided to you in the discharge paperwork  -Follow-up with outpatient primary care doctor and other specialists -for management of chronic medical disease, including: health maintenance checks  -Testing: Follow-up with outpatient provider for abnormal lab results: elevated LDL  -Recommend abstinence from alcohol, tobacco, and other illicit drug use at discharge.   -If your psychiatric symptoms recur,  worsen, or if you have side effects to your psychiatric medications, call your outpatient psychiatric provider, 911, 988 or go to the nearest emergency department.  -If suicidal thoughts recur, call your outpatient psychiatric provider, 911, 988 or go to the nearest emergency department.  Signed: Augusto Gamble, MD 11/13/2023, 9:21 AM

## 2023-11-13 NOTE — Discharge Instructions (Signed)
Dear Robert Harris,  It was a pleasure to take care of you during your stay at [facility] where you were treated for your MDD (major depressive disorder), recurrent severe, without psychosis (HCC).  While you were here, you were:  observed and cared for by our nurses and nursing assistants  treated with medications by your psychiatrists  evaluated with imaging / lab tests, and treated with medicines / procedures by your doctors  provided individual and group therapy by therapists  provided resources by our social workers and case managers  Please review the medication list provided to you at discharge and stop, start taking, or continue taking the medications listed there.  You should also follow-up with your primary care doctor, or start seeing one if you don't have one yet. If applicable, here are some scheduled follow-ups for you:  Follow-up Information     [facility], [facility]. Go to.   Why: Please go to this provider for group therapy and medication management services. Contact information: [address] [place] [address] [phone]                  I recommend abstinence from alcohol, tobacco, and other illicit drug use.   If your psychiatric symptoms or suicidal thoughts recur, worsen, or if you have side effects to your psychiatric medications, call your outpatient psychiatric provider, 911, 988 or go to the nearest emergency department.  Take care!  Signed: Augusto Gamble, MD 11/13/2023, 9:07 AM  Naloxone (Narcan) can help reverse an overdose when given to the victim quickly.  [place] offers free naloxone kits and instructions/training on its use.  Add naloxone to your first aid kit and you can help save a life. A prescription can be filled at your local pharmacy or free kits are provided by the county.  Pick up your free kit at the following locations:   [place]:  [facility], [address] [place] [address] [phone]) [facility] [address] [place] [address] [phone]) [facility] [address] [place] [address]  [place]: [facility] [address] [place] [address] ([phone]) [facility] [address] [place] [address] [phone])

## 2023-11-13 NOTE — [facility] Suicide Risk Assessment (Signed)
Suicide Risk Assessment  Discharge Assessment    [facility] Discharge Suicide Risk Assessment  Principal Problem: MDD (major depressive disorder), recurrent severe, without psychosis (HCC) Discharge Diagnoses: Principal Problem:   MDD (major depressive disorder), recurrent severe, without psychosis (HCC) Active Problems:   GAD (generalized anxiety disorder)   PTSD (post-traumatic stress disorder)   Polysubstance abuse (HCC)  Reason for Admission: Robert Harris is a 42 yr old male who presented on 11/27 to [facility] with worsening depression, grief, and SI, he was admitted to [facility] on 11/28. PPHx is significant for Depression, Anxiety, PTSD, and Polysubstance Abuse (Meth, EtOH, Cocaine, Acid, Shrooms), and Prior Suicide Attempts (put gun in mouth and pulled trigger and walked in traffic), a remote history of Self Injurious Behavior (Burning with lighter- years ago), and no Prior Psychiatric Hospitalizations  Hospital Summary During the patient's hospitalization, patient had extensive initial psychiatric evaluation, and follow-up psychiatric evaluations every day.   The following medications were managed: Scheduled Meds:  QUEtiapine  50 mg Oral QHS   sertraline  50 mg Oral Daily        PRN Meds:. acetaminophen, alum & mag hydroxide-simeth, bismuth subsalicylate, haloperidol **AND** LORazepam **AND** diphenhydrAMINE, haloperidol lactate **AND** LORazepam **AND** diphenhydrAMINE, hydrOXYzine, ondansetron, polyethylene glycol, senna, traZODone       Gradually, patient started adjusting to milieu. The patient was evaluated each day by a clinical provider to ascertain response to treatment. Improvement was noted by the patient's report of decreasing symptoms, improved sleep and appetite, affect, medication tolerance, behavior, and participation in unit programming.  Patient was asked each day to complete a self inventory noting mood, mental status, pain, new symptoms, anxiety and concerns.    Symptoms were reported as significantly decreased or resolved completely by discharge.  The patient reports that their mood is stable.  The patient denied having suicidal thoughts for more than 48 hours prior to discharge.  Patient denies having homicidal thoughts.  Patient denies having auditory hallucinations.  Patient denies any visual hallucinations or other symptoms of psychosis.  The patient was motivated to continue taking medication with a goal of continued improvement in mental health.    Symptoms were reported as significantly decreased or resolved completely by discharge.    On day of discharge, the patient reports that their mood is stable. The patient denied having suicidal thoughts for more than 48 hours prior to discharge.  Patient denies having homicidal thoughts.  Patient denies having auditory hallucinations.  Patient denies any visual hallucinations or other symptoms of psychosis. The patient was motivated to continue taking medication with a goal of continued improvement in mental health.    The patient reports their target psychiatric symptoms of suicidal ideations and depressed mood responded well to the psychiatric medications, and the patient reports overall benefit from psychiatric hospitalization. Supportive psychotherapy was provided to the patient. The patient also participated in regular group therapy while hospitalized. Coping skills, problem solving as well as relaxation therapies were also part of the unit programming.   Labs were reviewed with the patient, and abnormal results were discussed with the patient.   The patient is able to verbalize their individual safety plan to this provider.   # It is recommended to the patient to continue psychiatric medications as prescribed, after discharge from the hospital.     # It is recommended to the patient to follow up with your outpatient psychiatric provider and PCP.   # It was discussed with the patient, the impact of  alcohol, drugs, tobacco  have been there overall psychiatric and medical wellbeing, and total abstinence from substance use was recommended.   # Prescriptions provided or sent directly to preferred pharmacy at discharge. Patient agreeable to plan. Given opportunity to ask questions. Appears to feel comfortable with discharge.    # In the event of worsening symptoms, the patient is instructed to call the crisis hotline, 911 and or go to the nearest ED for appropriate evaluation and treatment of symptoms. To follow-up with primary care provider for other medical issues, concerns and or health care needs   # Patient was discharged  to [facility]  with a plan to follow up as noted below.   On day of discharge patient denying suicidal or homicidal ideations  Total Time spent with patient: 45 minutes  Musculoskeletal: Strength & Muscle Tone: within normal limits Gait & Station: normal Patient leans: N/A  Psychiatric Specialty Exam  Presentation  General Appearance: Casual  Eye Contact:Good  Speech:Clear and Coherent  Speech Volume:Normal  Handedness:Right   Mood and Affect  Mood:-- ("I feel good")  Duration of Depression Symptoms: Greater than two weeks  Affect:Appropriate; Congruent; Full Range   Thought Process  Thought Processes:Coherent; Goal Directed; Linear  Descriptions of Associations:Intact  Orientation:Full (Time, Place and Person)  Thought Content:WDL  History of Schizophrenia/Schizoaffective disorder:No  Duration of Psychotic Symptoms:No data recorded Hallucinations:Hallucinations: None  Ideas of Reference:None  Suicidal Thoughts:Suicidal Thoughts: No SI Passive Intent and/or Plan: Without Intent  Homicidal Thoughts:Homicidal Thoughts: No   Sensorium  Memory:Immediate Good; Recent Good; Remote Good  Judgment:Good  Insight:Good   Executive Functions  Concentration:Good  Attention Span:Good  Recall:Good  Fund of  Knowledge:Good  Language:Good   Psychomotor Activity  Psychomotor Activity:Psychomotor Activity: Normal   Assets  Assets:Resilience   Sleep  Sleep:Sleep: Fair   Physical Exam: Physical Exam Vitals and nursing note reviewed.  HENT:     Head: Normocephalic and atraumatic.  Pulmonary:     Effort: Pulmonary effort is normal.  Musculoskeletal:     Cervical back: Normal range of motion.  Neurological:     General: No focal deficit present.     Mental Status: He is alert.    Review of Systems  Constitutional: Negative.   Respiratory: Negative.    Cardiovascular: Negative.   Gastrointestinal: Negative.   Genitourinary: Negative.   Musculoskeletal:  Positive for neck pain.  Psychiatric/Behavioral:         Psychiatric subjective data addressed in PSE or HPI / daily subjective report   Blood pressure 103/76, pulse 87, temperature 97.9 F (36.6 C), temperature source Oral, resp. rate 16, height 5\' 11"  (1.803 m), weight 70.3 kg, SpO2 99%. Body mass index is 21.62 kg/m.  Mental Status Per Nursing Assessment::   On Admission:  Self-harm behaviors  Demographic Factors:  Male and Caucasian  Loss Factors: Financial problems/change in socioeconomic status  Historical Factors: NA  Risk Reduction Factors:   Positive coping skills or problem solving skills  Continued Clinical Symptoms:  Alcohol/Substance Abuse/Dependencies More than one psychiatric diagnosis  Cognitive Features That Contribute To Risk:  None    Suicide Risk:  Mild: There are no identifiable suicide plans, no associated intent, mild dysphoria and related symptoms, good self-control (both objective and subjective assessment), few other risk factors, and identifiable protective factors, including available and accessible social support.   Follow-up Information     Services, [facility]. Go to.   Why: Please go to this provider for group therapy and medication management services. Contact  information: [address] [place] [address] [phone]                 Plan Of Care/Follow-up recommendations:  Activity: as tolerated   Diet: heart healthy   Other: -Follow-up with your outpatient psychiatric provider -instructions on appointment date, time, and address (location) are provided to you in discharge paperwork.   -Take your psychiatric medications as prescribed at discharge - instructions are provided to you in the discharge paperwork   -Follow-up with outpatient primary care doctor and other specialists -for management of chronic medical disease, including: health maintenance checks   -Testing: Follow-up with outpatient provider for abnormal lab results: elevated LDL   -Recommend abstinence from alcohol, tobacco, and other illicit drug use at discharge.    -If your psychiatric symptoms recur, worsen, or if you have side effects to your psychiatric medications, call your outpatient psychiatric provider, 911, 988 or go to the nearest emergency department.   -If suicidal thoughts recur, call your outpatient psychiatric provider, 911, 988 or go to the nearest emergency department.  Signed: Augusto Gamble, MD 11/13/2023, 9:20 AM

## 2023-11-13 NOTE — Plan of Care (Signed)
  Problem: Activity: Goal: Interest or engagement in activities will improve Outcome: Progressing Goal: Sleeping patterns will improve Outcome: Progressing   

## 2023-11-13 NOTE — Progress Notes (Signed)
Patient discharged from [facility] on 11/13/23. Patient denies SI, plan, and intention. Suicide safety plan completed, reviewed with this RN, given to the patient, and a copy in the chart. Patient denies HI/AVH upon discharge. Patient is alert, oriented, and cooperative. RN provided patient with discharge paperwork and reviewed information with patient. Patient expressed that he understood all of the discharge instructions. Pt was satisfied with belongings returned to him from the locker and at bedside. Discharged patient to [facility] waiting room.

## 2023-11-13 NOTE — Plan of Care (Signed)
  Problem: Education: Goal: Emotional status will improve Outcome: Progressing Goal: Mental status will improve Outcome: Progressing   

## 2023-11-13 NOTE — Progress Notes (Signed)
  [facility] Adult Case Management Discharge Plan :  Will you be returning to the same living situation after discharge:  No. At discharge, do you have transportation home?: Yes,  Blue Marcelino Scot will take the patient to [facility] [facility] [place] on today at 12:30pm.  Do you have the ability to pay for your medications: Yes,  Patient has insurance   Release of information consent forms completed and in the chart;  Patient's signature needed at discharge.  Patient to Follow up at:  Follow-up Information     Services, [facility]. Go to.   Why: Please go to this provider for group therapy and medication management services. Contact information: [address] [place] [address] [phone]         [facility]. Go on 11/13/2023.   Why: Patient has been accepted to the [facility] on today 11/13/2023. Transportation has been arranged for 12:30pm. Patient encouraged to follow up with outpatient services as needed. Contact information: [address] [place], [place] [address] [phone]                Next level of care provider has access to [facility] Link:no  Safety Planning and Suicide Prevention discussed: No. Patient refused collateral.      Has patient been referred to the Quitline?: Patient refused referral for treatment  Patient has been referred for addiction treatment: Patient is being discharged to [facility] of [address] on today; Address is [address] [place], [place] [address] by 12:30pm. Facility made aware of transport time. No other needs were reported at this time.   Loleta Dicker, LCSW 11/13/2023, 10:35 AM

## 2023-12-02 ENCOUNTER — Encounter (HOSPITAL_COMMUNITY): Payer: Self-pay

## 2023-12-02 ENCOUNTER — Emergency Department (HOSPITAL_COMMUNITY)
Admission: EM | Admit: 2023-12-02 | Discharge: 2023-12-03 | Disposition: A | Discharge status: Home / Self Care | Payer: Medicaid Other | Attending: Emergency Medicine | Admitting: Emergency Medicine

## 2023-12-02 ENCOUNTER — Other Ambulatory Visit: Payer: Self-pay

## 2023-12-02 ENCOUNTER — Ambulatory Visit (HOSPITAL_COMMUNITY)
Admission: EM | Admit: 2023-12-02 | Discharge: 2023-12-02 | Disposition: A | Discharge status: Home / Self Care | Payer: Medicaid Other

## 2023-12-02 ENCOUNTER — Telehealth (HOSPITAL_COMMUNITY): Payer: Self-pay | Admitting: Emergency Medicine

## 2023-12-02 DIAGNOSIS — G4489 Other headache syndrome: Secondary | ICD-10-CM | POA: Diagnosis not present

## 2023-12-02 DIAGNOSIS — Z7951 Long term (current) use of inhaled steroids: Secondary | ICD-10-CM | POA: Diagnosis not present

## 2023-12-02 DIAGNOSIS — R519 Headache, unspecified: Secondary | ICD-10-CM | POA: Diagnosis present

## 2023-12-02 DIAGNOSIS — J45909 Unspecified asthma, uncomplicated: Secondary | ICD-10-CM | POA: Insufficient documentation

## 2023-12-02 DIAGNOSIS — F22 Delusional disorders: Secondary | ICD-10-CM

## 2023-12-02 DIAGNOSIS — G44209 Tension-type headache, unspecified, not intractable: Secondary | ICD-10-CM | POA: Diagnosis not present
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
## 2023-12-02 NOTE — Telephone Encounter (Signed)
Came into [facility] today with paranoia. Is now psych cleared.  Has a knot on the back of his head.  Unsure if he had a fall.  They are sending him over to be evaluated for his head injury but can be discharged from the emergency department if his workup here is reassuring.

## 2023-12-02 NOTE — Discharge Instructions (Addendum)
  Discharge recommendations:  Patient is to take medications as prescribed. Please see information for follow-up appointment with psychiatry and therapy. Please follow up with your primary care provider for all medical related needs.  Follow up with [facility] [place] for medication management.  Therapy: We recommend that patient participate in individual therapy to address mental health concerns.  Medications: The patient or guardian is to contact a medical professional and/or outpatient provider to address any new side effects that develop. The patient or guardian should update outpatient providers of any new medications and/or medication changes.   Atypical antipsychotics: If you are prescribed an atypical antipsychotic, it is recommended that your height, weight, BMI, blood pressure, fasting lipid panel, and fasting blood sugar be monitored by your outpatient providers.  Safety:  The patient should abstain from use of illicit substances/drugs and abuse of any medications. If symptoms worsen or do not continue to improve or if the patient becomes actively suicidal or homicidal then it is recommended that the patient return to the closest hospital emergency department, the [facility], or call 911 for further evaluation and treatment. National Suicide Prevention Lifeline 1-800-SUICIDE or 272-480-7495.  About 988 988 offers 24/7 access to trained crisis counselors who can help people experiencing mental health-related distress. People can call or text 988 or chat 988lifeline.org for themselves or if they are worried about a loved one who may need crisis support.  Crisis Mobile: [facility]:                     340 569 6344 (for crisis response 24 hours a day) [facility] Hotline:                                            (559) 115-7044

## 2023-12-02 NOTE — ED Notes (Signed)
Safe Transport requested to [employer].

## 2023-12-02 NOTE — ED Notes (Signed)
Report called to Charge Nurse Randie Heinz ED for evaluation of Headache, then discharge home.  Pt psych cleared at [facility].  Pending SAfe Transport to [facility].

## 2023-12-02 NOTE — ED Triage Notes (Signed)
Patient reports having a headache 9/10. States it started today. Has not taken any medicine for it.

## 2023-12-02 NOTE — ED Provider Notes (Signed)
Behavioral Health Urgent Care Medical Screening Exam  Patient Name: Robert Harris MRN: [id number] Date of Evaluation: 12/02/23 Chief Complaint:  headache Diagnosis:  Final diagnoses:  Paranoia (HCC)    History of Present illness: Shuayb Birschbach is a 42 y.o. male with a psychiatric history significant for MDD, GAD, PTSD, polysubstance abuse and prior suicide attempts presenting to [facility] UC with complaint of headache pain 8 out of 10 and just noticed a knot on the back of his head today.  Nurse practitioner assessed patient face-to-face and reviewed his chart.  Patient is alert oriented x 4, calm and cooperative, speech is clear and coherent, mood is euthymic with congruent affect, denies current SI or HI/AVH but endorses having some paranoia.  Patient states that he has been feeling like someone is following him or he sees a person he thinks he knows but then realizes is not that person.  Patient does not appear to be responding to any internal or external stimuli at this time.  During the assessment patient had his head down on the table for most the evaluation and reported that he has a headache 9/10 pain and knot on the back of his head. Patient states he does not think he has hit head but is is not able to say for sure. NP was able to palpate small bump in right occipital area.patient.  Patient denies having used any alcohol or any other illicit substances today or since he has been in the sober living house.  Patient does not appear intoxicated.  Per chart review patient was inpatient at [facility] from 11/07/2023 to 11/13/2023 for depression, grief and SI.  Patient was discharged to [facility] [place] with movements with services with [employer] [facility] the [place].  Patient does not meet inpatient criteria for psychiatric admission and will be transferred to [facility], ED for further evaluation of his headache and knot on the back of his head.  Recommended for patient to follow-up with  [facility] [place] for ongoing medication management.   Flowsheet Row ED from 12/02/2023 in [facility] Most recent reading at 12/02/2023  9:00 PM Ad[facility] (Discharged) [facility] Most recent reading at 11/07/2023 10:55 PM ED from 11/07/2023 in [facility] Most recent reading at 11/07/2023  3:42 PM  C-SSRS RISK CATEGORY Error: Question 6 not populated High Risk High Risk       Psychiatric Specialty Exam  Presentation  General Appearance:Disheveled  Eye Contact:Fair  Speech:Clear and Coherent  Speech Volume:Normal  Handedness:Right   Mood and Affect  Mood: Euthymic  Affect: Appropriate   Thought Process  Thought Processes: Coherent  Descriptions of Associations:Intact  Orientation:Full (Time, Place and Person)  Thought Content:WDL  Diagnosis of Schizophrenia or Schizoaffective disorder in past: No   Hallucinations:None  Ideas of Reference:None  Suicidal Thoughts:No Without Intent; Without Plan  Homicidal Thoughts:No   Sensorium  Memory: Immediate Fair; Recent Fair; Remote Fair  Judgment: Fair  Insight: Fair   Chartered certified accountant: Fair  Attention Span: Fair  Recall: [employer] of Knowledge: Fair  Language: Fair   Psychomotor Activity  Psychomotor Activity: Normal   Assets  Assets: Manufacturing systems engineer; Desire for Improvement; Housing; Resilience; Physical Health   Sleep  Sleep: Fair  Number of hours:  6   Physical Exam: Physical Exam HENT:     Head: Normocephalic.     Nose: Nose normal.  Eyes:  Pupils: Pupils are equal, round, and reactive to light.  Cardiovascular:     Rate and Rhythm: Normal rate.  Pulmonary:     Effort: Pulmonary effort is normal.  Abdominal:     General: Abdomen is flat.  Musculoskeletal:        General: Normal range of motion.  Skin:     General: Skin is warm.  Neurological:     Mental Status: He is alert and oriented to person, place, and time.  Psychiatric:        Attention and Perception: Attention normal.        Mood and Affect: Mood normal.        Speech: Speech normal.        Behavior: Behavior is cooperative.        Thought Content: Thought content is paranoid.        Cognition and Memory: Cognition normal.        Judgment: Judgment is impulsive.    Review of Systems  Constitutional: Negative.   HENT: Negative.    Eyes: Negative.   Respiratory: Negative.    Cardiovascular: Negative.   Gastrointestinal: Negative.   Genitourinary: Negative.   Musculoskeletal: Negative.   Skin: Negative.   Neurological:  Positive for headaches.   Blood pressure 106/78, pulse 100, temperature 98.6 F (37 C), temperature source Oral, resp. rate 18, SpO2 100%. There is no height or weight on file to calculate BMI.  Musculoskeletal: Strength & Muscle Tone: within normal limits Gait & Station: normal Patient leans: N/A   [facility] MSE Discharge Disposition for Follow up and Recommendations: Based on my evaluation the patient appears to have an emergency medical condition for which I recommend the patient be transferred to the emergency department for further evaluation.    Jasper Riling, NP 12/02/2023, 10:26 PM

## 2023-12-03 ENCOUNTER — Emergency Department (HOSPITAL_COMMUNITY): Payer: Medicaid Other

## 2023-12-03 MED ORDER — IBUPROFEN 400 MG PO TABS
400.0000 mg | ORAL_TABLET | Freq: Once | ORAL | Status: AC
  Administered 2023-12-03: 400 mg via ORAL
  Filled 2023-12-03: qty 1

## 2023-12-03 NOTE — Discharge Instructions (Signed)

## 2023-12-03 NOTE — ED Provider Notes (Signed)
[place] EMERGENCY DEPARTMENT AT [facility] Provider Note   CSN: [id number] Arrival date & time: 12/02/23  2341     History  Chief Complaint  Patient presents with   Headache    Robert Harris is a 42 y.o. male.  The history is provided by the patient.  Patient with history of depression, PTSD, substance use disorder presents for headache.  Patient reports his headache is gradually worsened over the past day.  He also reports he has a "knot "on the back of his scalp He does not endorse any falls or trauma. No fevers or vomiting.  No focal weakness. He does not recall having this headache previously    Past Medical History:  Diagnosis Date   Anxiety    Asthma     Home Medications Prior to Admission medications   Medication Sig Start Date End Date Taking? Authorizing Provider  albuterol (VENTOLIN HFA) 108 (90 Base) MCG/ACT inhaler Inhale 2 puffs into the lungs every 6 (six) hours as needed for wheezing or shortness of breath. 08/28/22   [provider]  QUEtiapine (SEROQUEL) 50 MG tablet Take 1 tablet (50 mg total) by mouth at bedtime. 11/13/23 12/13/23  Augusto Gamble, MD  sertraline (ZOLOFT) 50 MG tablet Take 1 tablet (50 mg total) by mouth daily. 11/14/23 12/14/23  Augusto Gamble, MD  traZODone (DESYREL) 50 MG tablet Take 1 tablet (50 mg total) by mouth at bedtime as needed for sleep. 11/13/23 12/13/23  Augusto Gamble, MD      Allergies    Patient has no known allergies.    Review of Systems   Review of Systems  Constitutional:  Negative for fever.  Gastrointestinal:  Negative for vomiting.  Neurological:  Positive for headaches.    Physical Exam Updated Vital Signs BP 114/80 (BP Location: Right Arm)   Pulse 84   Temp 98 F (36.7 C) (Oral)   Resp 15   Ht 1.803 m (5\' 11" )   Wt 68 kg   SpO2 96%   BMI 20.92 kg/m  Physical Exam CONSTITUTIONAL: Disheveled, resting comfortably lying prone HEAD: Normocephalic/atraumatic No visible trauma.  Likely lymph  node noted to the occipital region No significant tenderness, no abscess or cellulitis noted EYES: EOMI/PERRL, no nystagmus ENMT: Mucous membranes moist NECK: supple no meningeal signs NEURO:  Awake/alert, face symmetric, no arm or leg drift is noted Cranial nerves 3/4/5/6/06/18/09/11/12 tested and intact Gait normal without ataxia EXTREMITIES: pulses normal/equal, full ROM PSYCH: no abnormalities of mood noted, alert and oriented to situation  ED Results / Procedures / Treatments   Labs (all labs ordered are listed, but only abnormal results are displayed) Labs Reviewed - No data to display  EKG None  Radiology CT Head Wo Contrast Result Date: 12/03/2023 CLINICAL DATA:  Tension type headache EXAM: CT HEAD WITHOUT CONTRAST TECHNIQUE: Contiguous axial images were obtained from the base of the skull through the vertex without intravenous contrast. RADIATION DOSE REDUCTION: This exam was performed according to the departmental dose-optimization program which includes automated exposure control, adjustment of the mA and/or kV according to patient size and/or use of iterative reconstruction technique. COMPARISON:  08/12/2023 FINDINGS: Brain: No evidence of acute infarction, hemorrhage, mass, mass effect, or midline shift. No hydrocephalus or extra-axial fluid collection. Normal pituitary and craniocervical junction. Vascular: No hyperdense vessel. Skull: Negative for fracture or focal lesion. Sinuses/Orbits: Mucosal thickening in the ethmoid air cells and frontal sinuses. No acute finding in the orbits. Other: The mastoid air cells are well aerated.  IMPRESSION: No acute intracranial process. Electronically Signed   By: Wiliam Ke M.D.   On: 12/03/2023 01:55    Procedures Procedures    Medications Ordered in ED Medications  ibuprofen (ADVIL) tablet 400 mg (400 mg Oral Given 12/03/23 0125)    ED Course/ Medical Decision Making/ A&P Clinical Course as of 12/03/23 0200  Mon Dec 03, 2023   0123 Patient presents for headache.  He was just seen at behavioral health urgent care for paranoia but was deemed to be appropriate for discharge.  He reported headache at that time and was advised to go the ER. Patient's mental status is appropriate though he has been sleeping in the ER  He has no focal neurodeficits.  No signs of any head trauma.  Several months ago he was seen for a headache with negative imaging  Patient is a poor historian however, will obtain CT head.  If negative he will be discharged [DW]  0159 Patient had presented for behavioral health for reported headache.  He has been seen previously in the ER for headaches.  No signs of any head trauma.  Patient without any obvious neurodeficits, he is ambulatory.  CT head was negative.  Patient safe for discharge.  He is already been cleared by behavioral health [DW]    Clinical Course User Index [DW] [name], MD                                 Medical Decision Making Amount and/or Complexity of Data Reviewed Radiology: ordered.  Risk Prescription drug management.   This patient presents to the ED for concern of headache, this involves an extensive number of treatment options, and is a complaint that carries with it a high risk of complications and morbidity.  The differential diagnosis includes but is not limited to subarachnoid hemorrhage, intracranial hemorrhage, meningitis, encephalitis, CVST, temporal arteritis, idiopathic intracranial hypertension, migraine   Comorbidities that complicate the patient evaluation: Patient's presentation is complicated by their history of depression  Social Determinants of Health: Patient's  substance use disorder   increases the complexity of managing their presentation  Additional history obtained: Records reviewed  behavioral health notes reviewed  Imaging Studies ordered: I ordered imaging studies including CT scan head   I independently visualized and interpreted  imaging which showed no acute findings I agree with the radiologist interpretation   Medicines ordered and prescription drug management: I ordered medication including ibuprofen for headache Reevaluation of the patient after these medicines showed that the patient    stayed the same  Reevaluation: After the interventions noted above, I reevaluated the patient and found that they have :improved  Complexity of problems addressed: Patient's presentation is most consistent with  acute presentation with potential threat to life or bodily function  Disposition: After consideration of the diagnostic results and the patient's response to treatment,  I feel that the patent would benefit from discharge   .           Final Clinical Impression(s) / ED Diagnoses Final diagnoses:  Other headache syndrome    Rx / DC Orders ED Discharge Orders     None         [name], MD 12/03/23 0200

## 2023-12-14 DIAGNOSIS — F4310 Post-traumatic stress disorder, unspecified: Secondary | ICD-10-CM | POA: Diagnosis not present

## 2023-12-17 ENCOUNTER — Ambulatory Visit: Payer: Medicaid Other | Admitting: Physician Assistant

## 2023-12-19 ENCOUNTER — Encounter (HOSPITAL_COMMUNITY): Payer: Self-pay | Admitting: Student

## 2023-12-19 ENCOUNTER — Ambulatory Visit (INDEPENDENT_AMBULATORY_CARE_PROVIDER_SITE_OTHER): Payer: Medicaid Other | Admitting: Student

## 2023-12-19 VITALS — BP 112/81 | HR 82 | Ht 71.0 in | Wt 163.0 lb

## 2023-12-19 DIAGNOSIS — F4310 Post-traumatic stress disorder, unspecified: Secondary | ICD-10-CM

## 2023-12-19 DIAGNOSIS — F1921 Other psychoactive substance dependence, in remission: Secondary | ICD-10-CM

## 2023-12-19 DIAGNOSIS — F3341 Major depressive disorder, recurrent, in partial remission: Secondary | ICD-10-CM

## 2023-12-19 MED ORDER — SERTRALINE HCL 100 MG PO TABS: 100.0000 mg | ORAL_TABLET | Freq: Every day | ORAL | 1 refills | Status: DC

## 2023-12-19 MED ORDER — PRAZOSIN HCL 1 MG PO CAPS: 1.0000 mg | ORAL_CAPSULE | Freq: Every day | ORAL | 1 refills | Status: DC

## 2023-12-19 NOTE — Patient Instructions (Signed)
 [facility]  URGENT CARE: Open 24 hours per day for acute and/or urgent behavioral health concerns.   OUTPATIENT Walk-in information:  Please note, all walk-ins are first come & first serve, with limited number of availability. Therapist for therapy:  Monday, Tuesday, Wednesday & Thursday mornings Please ARRIVE at 7:00 AM for registration Will START at 8:00 AM Every 1st, 2nd & 3rd Friday of the month: Please ARRIVE at 7:00 AM for registration Will START at 1 PM - 5 PM Psychiatrist for medication management: Monday - Friday:  Please ARRIVE at 7:00 AM for registration Will START at 8:00 AM Appointments times are as follows:  - New patients get 1 hr ex: 8-9 am 9-10 & 10-11 then that's it.  - Existing pt's that are not seeing their provider will still take 1hr as they would be new to the provider that is covering walk ins that day.  - Existing follow ups get 30 mins.... (if they have been seen by the provider covering walk ins that day.)        Regretfully, due to limited availability, please be aware that you may not been seen on the same day as walk-in. Please consider making an appoint or try again. Thank you for your patience and understanding.  [facility] [place] [address] [place], [place] [address] [phone]  New patients are seen at their walk-in clinic. Walk-in hours are Monday - Friday from 8:30 am - 12:00 pm, and from 1:00 pm - 2:30 pm.   Walk-in patients are seen on a first come, first served basis, so try to arrive as early as possible for the best chance of being seen the same day.
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
## 2023-12-24 ENCOUNTER — Encounter (HOSPITAL_COMMUNITY): Payer: Self-pay | Admitting: Student

## 2023-12-24 DIAGNOSIS — F3341 Major depressive disorder, recurrent, in partial remission: Secondary | ICD-10-CM | POA: Insufficient documentation

## 2023-12-24 DIAGNOSIS — F4310 Post-traumatic stress disorder, unspecified: Secondary | ICD-10-CM | POA: Diagnosis not present

## 2023-12-24 DIAGNOSIS — F1921 Other psychoactive substance dependence, in remission: Secondary | ICD-10-CM | POA: Insufficient documentation

## 2023-12-26 DIAGNOSIS — F4310 Post-traumatic stress disorder, unspecified: Secondary | ICD-10-CM | POA: Diagnosis not present

## 2024-01-03 DIAGNOSIS — F4310 Post-traumatic stress disorder, unspecified: Secondary | ICD-10-CM | POA: Diagnosis not present

## 2024-01-08 DIAGNOSIS — F4310 Post-traumatic stress disorder, unspecified: Secondary | ICD-10-CM | POA: Diagnosis not present

## 2024-01-10 DIAGNOSIS — F4310 Post-traumatic stress disorder, unspecified: Secondary | ICD-10-CM | POA: Diagnosis not present

## 2024-01-13 ENCOUNTER — Emergency Department (HOSPITAL_COMMUNITY)
Admission: EM | Admit: 2024-01-13 | Discharge: 2024-01-13 | Disposition: A | Discharge status: Home / Self Care | Payer: Medicaid Other | Attending: Emergency Medicine | Admitting: Emergency Medicine

## 2024-01-13 ENCOUNTER — Encounter (HOSPITAL_COMMUNITY): Payer: Self-pay

## 2024-01-13 ENCOUNTER — Other Ambulatory Visit: Payer: Self-pay

## 2024-01-13 ENCOUNTER — Emergency Department (HOSPITAL_COMMUNITY): Payer: Medicaid Other

## 2024-01-13 DIAGNOSIS — J069 Acute upper respiratory infection, unspecified: Secondary | ICD-10-CM | POA: Insufficient documentation

## 2024-01-13 DIAGNOSIS — Z20822 Contact with and (suspected) exposure to covid-19: Secondary | ICD-10-CM | POA: Diagnosis not present

## 2024-01-13 DIAGNOSIS — B9789 Other viral agents as the cause of diseases classified elsewhere: Secondary | ICD-10-CM | POA: Diagnosis not present

## 2024-01-13 DIAGNOSIS — R059 Cough, unspecified: Secondary | ICD-10-CM | POA: Diagnosis not present

## 2024-01-13 DIAGNOSIS — H6121 Impacted cerumen, right ear: Secondary | ICD-10-CM | POA: Insufficient documentation

## 2024-01-13 LAB — RESP PANEL BY RT-PCR (RSV, FLU A&B, COVID)  RVPGX2
Influenza A by PCR: NEGATIVE
Influenza B by PCR: NEGATIVE
Resp Syncytial Virus by PCR: NEGATIVE
SARS Coronavirus 2 by RT PCR: NEGATIVE

## 2024-01-13 NOTE — ED Provider Notes (Signed)
[place] EMERGENCY DEPARTMENT AT [facility] Provider Note   CSN: [id number] Arrival date & time: 01/13/24  1101     History  Chief Complaint  Patient presents with   Cough    Robert Harris is a 43 y.o. male with no significant past medical history presents with concern for congestion, ear pain, body aches, and cough over the past couple of days.  Reports cough is productive with a yellowish-green mucus.  Denies any fever or chills at home.  Denies abdominal pain, nausea or vomiting.   Cough      Home Medications Prior to Admission medications   Medication Sig Start Date End Date Taking? Authorizing Provider  albuterol (VENTOLIN HFA) 108 (90 Base) MCG/ACT inhaler Inhale 2 puffs into the lungs every 6 (six) hours as needed for wheezing or shortness of breath. 08/28/22   [provider]  prazosin (MINIPRESS) 1 MG capsule Take 1-2 capsules (1-2 mg total) by mouth at bedtime. Take 1 tablet (1 mg) nightly for 1 week then increase to 2 tablets (2 mg) nightly. 12/19/23 02/17/24  Elsie Lincoln, MD  sertraline (ZOLOFT) 100 MG tablet Take 1 tablet (100 mg total) by mouth daily. 12/19/23 02/17/24  Elsie Lincoln, MD      Allergies    Patient has no known allergies.    Review of Systems   Review of Systems  Respiratory:  Positive for cough.     Physical Exam Updated Vital Signs BP 137/80 (BP Location: Left Arm)   Pulse 96   Temp 97.9 F (36.6 C) (Oral)   Resp 17   Ht 5\' 11"  (1.803 m)   Wt 77.1 kg   SpO2 97%   BMI 23.71 kg/m  Physical Exam Vitals and nursing note reviewed.  Constitutional:      General: He is not in acute distress.    Appearance: He is well-developed.  HENT:     Head: Normocephalic and atraumatic.     Ears:     Comments: Right ear canal with cerumen, difficult to visualize TM Left TM without any erythema or bulging Eyes:     Conjunctiva/sclera: Conjunctivae normal.  Cardiovascular:     Rate and Rhythm: Normal rate and regular  rhythm.     Heart sounds: No murmur heard. Pulmonary:     Effort: Pulmonary effort is normal. No respiratory distress.     Breath sounds: Normal breath sounds.  Abdominal:     Palpations: Abdomen is soft.     Tenderness: There is no abdominal tenderness.  Musculoskeletal:        General: No swelling.     Cervical back: Neck supple.  Skin:    General: Skin is warm and dry.     Capillary Refill: Capillary refill takes less than 2 seconds.  Neurological:     Mental Status: He is alert.  Psychiatric:        Mood and Affect: Mood normal.     ED Results / Procedures / Treatments   Labs (all labs ordered are listed, but only abnormal results are displayed) Labs Reviewed  RESP PANEL BY RT-PCR (RSV, FLU A&B, COVID)  [id number]    EKG None  Radiology DG Chest 2 View Result Date: 01/13/2024 CLINICAL DATA:  Productive cough. EXAM: CHEST - 2 VIEW COMPARISON:  July 27, 2009. FINDINGS: The heart size and mediastinal contours are within normal limits. Both lungs are clear. The visualized skeletal structures are unremarkable. IMPRESSION: No active cardiopulmonary disease. Electronically Signed  By: Lupita Raider M.D.   On: 01/13/2024 13:20    Procedures Procedures    Medications Ordered in ED Medications - No data to display  ED Course/ Medical Decision Making/ A&P                                 Medical Decision Making Amount and/or Complexity of Data Reviewed Radiology: ordered.     Differential diagnosis includes but is not limited to COVID, flu, RSV, viral URI, strep pharyngitis, viral pharyngitis, allergic rhinitis, pneumonia, bronchitis   ED Course:  Patient very well-appearing, stable vital signs.  Lungs clear to auscultation bilaterally.  Although patient reports concern for ear pain, no signs of otitis media on exam.  Suspect this is likely due to eustachian tube dysfunction secondary to congestion. I Ordered, and personally interpreted labs and imaging.  The  pertinent results include:  COVID, flu, RSV negative Chest x-ray without any acute abnormalities, no concern for pneumonia at this time Suspect patient has viral URI.  He reports he has been able to tolerate p.o. at home.  Stable and appropriate for discharge home at this time  Impression: Viral URI  Disposition:  The patient was discharged home with instructions to over-the-counter medications as needed for symptom control.  Flonase 2 puffs in each nostril daily for congestion.  Keep well hydrated at home.  Follow-up with PCP if symptoms not improving within the next 5 days. Return precautions given.  Imaging Studies ordered: I ordered imaging studies including x-ray I independently visualized the imaging with scope of interpretation limited to determining acute life threatening conditions related to emergency care. Imaging showed no acute abnormalities I agree with the radiologist interpretation              Final Clinical Impression(s) / ED Diagnoses Final diagnoses:  Viral URI with cough    Rx / DC Orders ED Discharge Orders     None         Arabella Merles, Cordelia Poche 01/13/24 1349    Linwood Dibbles, MD 01/14/24 0900

## 2024-01-13 NOTE — Discharge Instructions (Addendum)
You appear to have an upper respiratory infection (URI). An upper respiratory tract infection, or cold, is a viral infection of the air passages leading to the lungs. It should improve gradually after 5-7 days. You may have a lingering cough that lasts for 2- 4 weeks after the infection.  Your flu, covid, and RSV test were negative today.  Your chest x-ray did not show any signs of pneumonia  There are no medications, such as antibiotics, that will cure your infection.   Home care instructions:  You can take Tylenol and/or Ibuprofen as directed on the packaging for fever reduction and pain relief.    For cough: honey 1/2 to 1 teaspoon (you can dilute the honey in water or another fluid).  You can also use guaifenesin and dextromethorphan for cough which are over-the-counter medications. You can use a humidifier for chest congestion and cough.  If you don't have a humidifier, you can sit in the bathroom with the hot shower running.      For sore throat: try warm salt water gargles, cepacol lozenges, throat spray, warm tea or water with lemon/honey, popsicles or ice, or OTC cold relief medicine for throat discomfort.    For congestion: Flonase (fluticasone) 1-2 sprays in each nostril daily.    It is important to stay hydrated: drink plenty of fluids (water, gatorade/powerade/pedialyte, juices, or teas) to keep your throat moisturized and help further relieve irritation/discomfort.   Your illness is contagious and can be spread to others, especially during the first 3 or 4 days. It cannot be cured by antibiotics or other medicines. Take basic precautions such as washing your hands often, covering your mouth when you cough or sneeze, and avoiding public places where you could spread your illness to others.   Follow-up instructions: Please follow-up with your primary care provider for further evaluation of your symptoms if you are not feeling better within the next 5 days.   Return instructions:   Please return to the Emergency Department if you experience worsening symptoms.  RETURN IMMEDIATELY IF you develop shortness of breath, confusion or altered mental status, a new rash, become dizzy, faint, or poorly responsive, or are unable to be cared for at home. Please return if you have persistent vomiting and cannot keep down fluids or develop a fever that is not controlled by tylenol or motrin.   Please return if you have any other emergent concerns.

## 2024-01-13 NOTE — ED Triage Notes (Addendum)
Patient reports productive cough, congestion, ear pain, body aches, headache, and fatigue x "a couple days". Denies nausea and vomiting.

## 2024-01-14 DIAGNOSIS — F4310 Post-traumatic stress disorder, unspecified: Secondary | ICD-10-CM | POA: Diagnosis not present

## 2024-01-15 ENCOUNTER — Ambulatory Visit
Admission: EM | Admit: 2024-01-15 | Discharge: 2024-01-15 | Disposition: A | Discharge status: Home / Self Care | Payer: Medicaid Other | Attending: Internal Medicine | Admitting: Internal Medicine

## 2024-01-15 ENCOUNTER — Other Ambulatory Visit: Payer: Self-pay

## 2024-01-15 DIAGNOSIS — J209 Acute bronchitis, unspecified: Secondary | ICD-10-CM

## 2024-01-15 MED ORDER — AMOXICILLIN-POT CLAVULANATE 875-125 MG PO TABS: 1.0000 | ORAL_TABLET | Freq: Two times a day (BID) | ORAL | 0 refills | Status: AC

## 2024-01-15 MED ORDER — METHYLPREDNISOLONE 4 MG PO TBPK: ORAL_TABLET | ORAL | 0 refills | Status: DC

## 2024-01-15 MED ORDER — BENZONATATE 200 MG PO CAPS: 200.0000 mg | ORAL_CAPSULE | Freq: Three times a day (TID) | ORAL | 0 refills | Status: DC | PRN

## 2024-01-15 NOTE — ED Triage Notes (Signed)
C/O flu like symptoms for 4 days. Patient states he was seen at [facility] and had negative flu, covid and chest X-ray. C/O chills, body aches and productive cough. Currently taking Mucinex and ibuprofen.

## 2024-01-15 NOTE — Discharge Instructions (Addendum)
 A prescription was sent for Augmentin . This is an antibiotic used to treat upper respiratory infections. Take as directed. I have also sent you a cough medicine and a steroid pack to help with your congestion.   Return in 3-4 days if no improvement. It is very important for you to pay attention to any new symptoms or worsening of your current condition. Please go directly to the Emergency Department immediately should you begin to have any of the following symptoms: shortness of breath, chest pain or difficulty breathing.

## 2024-01-15 NOTE — ED Provider Notes (Signed)
 [facility]  Note:  This document was prepared using Dragon voice recognition software and may include unintentional dictation errors.  MRN: [id number] DOB: [birth year] DATE: 01/15/24   Subjective:  Chief Complaint:  Chief Complaint  Patient presents with   Cough     HPI: Robert Harris is a 43 y.o. male presenting for productive cough and congestion for the past 4 days. Patient states he was seen in the ER on 01/13/2024 for similar symptoms. Flu, COVID, and RSV were all negative at that time. CXR was unremarkable. He was diagnosed with a viral URI and OTC medications were recommend. He reports ongoing cough and congestion. He states he has been having headache and myalgias as well. He has been taking Mucinex with no relief. Denies fever, nausea/vomiting, abdominal pain, otalgia. Endorses cough, congestion, headache, myalgias, sore throat. Presents NAD.  Prior to Admission medications   Medication Sig Start Date End Date Taking? Authorizing Provider  albuterol  (VENTOLIN  HFA) 108 (90 Base) MCG/ACT inhaler Inhale 2 puffs into the lungs every 6 (six) hours as needed for wheezing or shortness of breath. 08/28/22   [provider]  prazosin  (MINIPRESS ) 1 MG capsule Take 1-2 capsules (1-2 mg total) by mouth at bedtime. Take 1 tablet (1 mg) nightly for 1 week then increase to 2 tablets (2 mg) nightly. 12/19/23 02/17/24  Barbra Jayson LABOR, MD  sertraline  (ZOLOFT ) 100 MG tablet Take 1 tablet (100 mg total) by mouth daily. 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 No pertinent family history.  Social History   Tobacco Use   Smoking status: Every Day    Current packs/day: 0.25    Average packs/day: 0.3 packs/day for 20.0 years (5.0 ttl pk-yrs)    Types: Cigarettes   Smokeless tobacco: Current    Types: Chew   Tobacco comments:    every now and then  Vaping Use   Vaping status: Every Day   Substances: Nicotine, THC  Substance Use Topics   Alcohol use: Not Currently    Alcohol/week: 6.0 standard drinks of alcohol    Types: 6 Cans of beer per week    Comment: few times weekly   Drug use: Not Currently    Types: Marijuana, Cocaine, Methamphetamines    Review of Systems  Constitutional:  Positive for chills and fatigue. Negative for fever.  HENT:  Positive for congestion, rhinorrhea, sinus pressure and sore throat. Negative for ear pain.   Respiratory:  Positive for cough.   Gastrointestinal:  Negative for abdominal pain, nausea and vomiting.  Musculoskeletal:  Positive for myalgias.  Neurological:  Positive for headaches.     Objective:   Vitals: BP 111/73 (BP Location: Right Arm)   Pulse 80   Temp (!) 97.5 F (36.4 C) (Oral)  Resp 16   SpO2 98%   Physical Exam Constitutional:      General: He is not in acute distress.    Appearance: Normal appearance. He is well-developed and normal weight. He is not ill-appearing or toxic-appearing.  HENT:     Head: Normocephalic and atraumatic.     Right Ear: There is impacted cerumen.     Left Ear: Tympanic membrane and ear canal normal.     Mouth/Throat:     Pharynx: Uvula midline. Posterior oropharyngeal erythema present. No pharyngeal swelling or oropharyngeal exudate.     Tonsils: No tonsillar exudate or tonsillar  abscesses.  Cardiovascular:     Rate and Rhythm: Normal rate and regular rhythm.     Heart sounds: Normal heart sounds.  Pulmonary:     Effort: Pulmonary effort is normal.     Breath sounds: Normal breath sounds.     Comments: Clear to auscultation bilaterally  Abdominal:     General: Bowel sounds are normal.     Palpations: Abdomen is soft.     Tenderness: There is no abdominal tenderness.  Skin:    General: Skin is warm and dry.  Neurological:     General: No focal deficit present.     Mental Status: He is alert.  Psychiatric:        Mood and Affect: Mood and affect normal.     Results:  Labs: No results found for this or any previous visit (from the past 24 hours).  Radiology: No results found.   UC Course/Treatments:  Procedures: Procedures   Medications Ordered in UC: Medications - No data to display   Assessment and Plan :     ICD-10-CM   1. Acute bronchitis, unspecified organism  J20.9      Acute bronchitis, unspecified organism Afebrile, nontoxic-appearing, NAD. VSS. DDX includes but not limited to: COVID, flu, bronchitis, pneumonia, viral URI COVID and flu not ordered given length of symptoms. CXR was unremarkable from 2 days ago. Given ongoing worsening of symptoms, Augmentin  875 mg twice daily was prescribed.  He was also given a Medrol  Dosepak as directed and Benzonatate  200mg  TID PRN was prescribed for cough. Strict ED precautions were given and patient verbalized understanding.  ED Discharge Orders          Ordered    amoxicillin -clavulanate (AUGMENTIN ) 875-125 MG tablet  Every 12 hours        01/15/24 1421    benzonatate  (TESSALON ) 200 MG capsule  3 times daily PRN        01/15/24 1421    methylPREDNISolone  (MEDROL  DOSEPAK) 4 MG TBPK tablet        01/15/24 1421             PDMP not reviewed this encounter.      Ahmere Hemenway P, PA-C 01/15/24 1459

## 2024-01-22 ENCOUNTER — Ambulatory Visit: Payer: Medicaid Other | Admitting: Physician Assistant

## 2024-01-25 DIAGNOSIS — F4310 Post-traumatic stress disorder, unspecified: Secondary | ICD-10-CM | POA: Diagnosis not present

## 2024-01-31 DIAGNOSIS — F4310 Post-traumatic stress disorder, unspecified: Secondary | ICD-10-CM | POA: Diagnosis not present

## 2024-02-04 ENCOUNTER — Ambulatory Visit
Admission: EM | Admit: 2024-02-04 | Discharge: 2024-02-04 | Disposition: A | Discharge status: Home / Self Care | Payer: Medicaid Other | Attending: Emergency Medicine | Admitting: Emergency Medicine

## 2024-02-04 ENCOUNTER — Encounter: Payer: Self-pay | Admitting: Emergency Medicine

## 2024-02-04 ENCOUNTER — Other Ambulatory Visit: Payer: Self-pay

## 2024-02-04 DIAGNOSIS — R112 Nausea with vomiting, unspecified: Secondary | ICD-10-CM | POA: Diagnosis not present

## 2024-02-04 DIAGNOSIS — R197 Diarrhea, unspecified: Secondary | ICD-10-CM | POA: Diagnosis not present

## 2024-02-04 DIAGNOSIS — A084 Viral intestinal infection, unspecified: Secondary | ICD-10-CM | POA: Diagnosis not present

## 2024-02-04 MED ORDER — LOPERAMIDE HCL 2 MG PO TABS
2.0000 mg | ORAL_TABLET | Freq: Four times a day (QID) | ORAL | 0 refills | Status: DC | PRN
Start: 2024-02-04 — End: 2024-07-21

## 2024-02-04 MED ORDER — ONDANSETRON 4 MG PO TBDP: 4.0000 mg | ORAL_TABLET | Freq: Three times a day (TID) | ORAL | 0 refills | Status: DC | PRN

## 2024-02-04 NOTE — Discharge Instructions (Signed)
 Please read below to learn more about the medications, dosages and frequencies that I recommend to help alleviate your symptoms and to get you feeling better soon:   Zofran (ondansetron): This is a good antinausea medication that you can take every 8 hours as needed for symptoms of nausea and vomiting.  It is a dissolvable tablet that you do not need to take with water, just put it under your tongue and let it melt away.  I have sent a prescription to your pharmacy.   Imodium (loperamide): This is a good antidiarrheal medication that you can take after every episode of diarrhea to help slow your bowels and reduce the volume as well as the frequency of diarrhea.  You take this medication up to 4 times daily.   If symptoms have not meaningfully improved in the next 3 to 5 days, please return for repeat evaluation or follow-up with your regular provider.  If symptoms have worsened in the next 3 to 5 days, please go to the emergency room for further evaluation.    Thank you for visiting urgent care today.  We appreciate the opportunity to participate in your care.

## 2024-02-04 NOTE — ED Provider Notes (Signed)
 [facility]    CSN: [id number] Arrival date & time: 02/04/24  1506    HISTORY   Chief Complaint  Patient presents with   Diarrhea   Emesis   HPI Robert Harris. is a pleasant, 43 y.o. male who presents to urgent care today. Patient complains of a 2-day history of nausea, vomiting and diarrhea, reports 5 episodes of diarrhea today.  Patient states he has not vomited today.  Patient states he has not tried any medications to alleviate his symptoms.  Patient states he has not had similar symptoms in the past.  Patient is requesting a note for work.  Patient has normal vital signs on arrival today and appears to be in no acute stress.  The history is provided by the patient.   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   Polysubstance abuse (HCC) 11/08/2023   Patient Active Problem List   Diagnosis Date Noted   Polysubstance dependence in early, early partial, sustained full, or sustained partial remission (HCC) 12/24/2023   MDD (major depressive disorder), recurrent, in partial remission (HCC) 12/24/2023   PTSD (post-traumatic stress disorder) 11/08/2023   Past Surgical History:  Procedure Laterality Date   CLOSED REDUCTION METACARPAL WITH PERCUTANEOUS PINNING Left 02/12/2021   Procedure: open reduction and internal fixation of metacarpal left thumb;  Surgeon: Bradly Bienenstock, MD;  Location: [facility] OR;  Service: Orthopedics;  Laterality: Left;  with IV sedation   COLONOSCOPY     TONSILLECTOMY     WISDOM TOOTH EXTRACTION      Home Medications    Prior to Admission medications   Medication Sig Start Date End Date Taking? Authorizing Provider  albuterol (VENTOLIN HFA) 108 (90 Base) MCG/ACT inhaler Inhale 2 puffs into the lungs every 6 (six) hours as needed for wheezing or shortness of breath.  08/28/22   [provider]  benzonatate (TESSALON) 200 MG capsule Take 1 capsule (200 mg total) by mouth 3 (three) times daily as needed for cough. 01/15/24   Hermanns, Ashlee P, PA-C  methylPREDNISolone (MEDROL DOSEPAK) 4 MG TBPK tablet Day 1: 8 mg PO before breakfast, 4 mg after lunch and after dinner, and 8 mg at bedtime Day 2: 4 mg PO before breakfast, after lunch, and after dinner and 8 mg at bedtime Day 3: 4 mg PO before breakfast, after lunch, after dinner, and at bedtime Day 4: 4 mg PO before breakfast, after lunch, and at bedtime Day 5: 4 mg PO before breakfast and at bedtime Day 6: 4 mg PO before breakfast 01/15/24   Hermanns, Ashlee P, PA-C  prazosin (MINIPRESS) 1 MG capsule Take 1-2 capsules (1-2 mg total) by mouth at bedtime. Take 1 tablet (1 mg) nightly for 1 week then increase to 2 tablets (2 mg) nightly. 12/19/23 02/17/24  Elsie Lincoln, MD  QUEtiapine (SEROQUEL) 25 MG tablet Take 25 mg by mouth. 11/13/23   [provider]  sertraline (ZOLOFT) 100 MG tablet Take 1 tablet (100 mg total) by mouth daily. 12/19/23 02/17/24  Elsie Lincoln, MD    Family History History reviewed. No pertinent family history. Social History Social History   Tobacco Use   Smoking status: Every Day    Current packs/day: 0.25    Average packs/day: 0.3 packs/day for 20.0 years (5.0 ttl pk-yrs)    Types: Cigarettes  Smokeless tobacco: Current    Types: Chew   Tobacco comments:    "every now and then"  Vaping Use   Vaping status: Every Day   Substances: Nicotine, THC  Substance Use Topics   Alcohol use: Not Currently    Alcohol/week: 6.0 standard drinks of alcohol    Types: 6 Cans of beer per week    Comment: few times weekly   Drug use: Not Currently    Types: Marijuana, Cocaine, Methamphetamines   Allergies   Patient has no known allergies.  Review of Systems Review of Systems Pertinent findings revealed after performing a 14 point review of systems has been noted in the history  of present illness.  Physical Exam Vital Signs BP 119/75 (BP Location: Right Arm)   Pulse 92   Temp 98.1 F (36.7 C) (Oral)   Resp 18   SpO2 98%   No data found.  Physical Exam Vitals and nursing note reviewed.  Constitutional:      General: He is not in acute distress.    Appearance: Normal appearance. He is normal weight. He is not ill-appearing.  HENT:     Head: Normocephalic and atraumatic.     Right Ear: Tympanic membrane, ear canal and external ear normal.     Left Ear: Tympanic membrane, ear canal and external ear normal.     Nose: Nose normal.     Mouth/Throat:     Mouth: Mucous membranes are dry.     Pharynx: Oropharynx is clear.  Eyes:     Extraocular Movements: Extraocular movements intact.     Conjunctiva/sclera: Conjunctivae normal.     Pupils: Pupils are equal, round, and reactive to light.  Cardiovascular:     Rate and Rhythm: Normal rate and regular rhythm.  Pulmonary:     Effort: Pulmonary effort is normal.     Breath sounds: Normal breath sounds.  Abdominal:     General: Abdomen is flat. Bowel sounds are normal.     Palpations: Abdomen is soft.  Musculoskeletal:        General: Normal range of motion.     Cervical back: Full passive range of motion without pain, normal range of motion and neck supple.  Lymphadenopathy:     Cervical: No cervical adenopathy.  Skin:    General: Skin is warm and dry.  Neurological:     General: No focal deficit present.     Mental Status: He is alert and oriented to person, place, and time. Mental status is at baseline.  Psychiatric:        Mood and Affect: Mood normal.        Behavior: Behavior normal.        Thought Content: Thought content normal.        Judgment: Judgment normal.     Visual Acuity Right Eye Distance:   Left Eye Distance:   Bilateral Distance:    Right Eye Near:   Left Eye Near:    Bilateral Near:     UC Couse / Diagnostics / Procedures:     Radiology No results  found.  Procedures Procedures (including critical care time) EKG  Pending results:  Labs Reviewed - No data to display  Medications Ordered in UC: Medications - No data to display  UC Diagnoses / Final Clinical Impressions(s)   I have reviewed the triage vital signs and the nursing notes.  Pertinent labs & imaging results that were available during my care of the patient were reviewed by  me and considered in my medical decision making (see chart for details).    Final diagnoses:  Nausea vomiting and diarrhea  Viral gastroenteritis   Viral testing not indicated due to duration of symptoms.  Suspect viral gastroenteritis.  Patient provided with a note for work.  Patient provided with Imodium for diarrhea and Zofran for nausea as needed.  Return precautions advised.  Please see discharge instructions below for details of plan of care as provided to patient. ED Prescriptions     Medication Sig Dispense Auth. Provider   ondansetron (ZOFRAN-ODT) 4 MG disintegrating tablet Take 1 tablet (4 mg total) by mouth every 8 (eight) hours as needed for nausea or vomiting. 20 tablet Theadora Rama Scales, PA-C   loperamide (IMODIUM A-D) 2 MG tablet Take 1 tablet (2 mg total) by mouth 4 (four) times daily as needed for diarrhea or loose stools. 30 tablet Theadora Rama Scales, PA-C      PDMP not reviewed this encounter.  Pending results:  Labs Reviewed - No data to display    Discharge Instructions      Please read below to learn more about the medications, dosages and frequencies that I recommend to help alleviate your symptoms and to get you feeling better soon:   Zofran (ondansetron): This is a good antinausea medication that you can take every 8 hours as needed for symptoms of nausea and vomiting.  It is a dissolvable tablet that you do not need to take with water, just put it under your tongue and let it melt away.  I have sent a prescription to your pharmacy.   Imodium  (loperamide): This is a good antidiarrheal medication that you can take after every episode of diarrhea to help slow your bowels and reduce the volume as well as the frequency of diarrhea.  You take this medication up to 4 times daily.   If symptoms have not meaningfully improved in the next 3 to 5 days, please return for repeat evaluation or follow-up with your regular provider.  If symptoms have worsened in the next 3 to 5 days, please go to the emergency room for further evaluation.    Thank you for visiting urgent care today.  We appreciate the opportunity to participate in your care.       Disposition Upon Discharge:  Condition: stable for discharge home  Patient presented with an acute illness with associated systemic symptoms and significant discomfort requiring urgent management. In my opinion, this is a condition that a prudent lay person (someone who possesses an average knowledge of health and medicine) may potentially expect to result in complications if not addressed urgently such as respiratory distress, impairment of bodily function or dysfunction of bodily organs.   Routine symptom specific, illness specific and/or disease specific instructions were discussed with the patient and/or caregiver at length.   As such, the patient has been evaluated and assessed, work-up was performed and treatment was provided in alignment with urgent care protocols and evidence based medicine.  Patient/parent/caregiver has been advised that the patient may require follow up for further testing and treatment if the symptoms continue in spite of treatment, as clinically indicated and appropriate.  Patient/parent/caregiver has been advised to return to the [facility] or PCP if no better; to PCP or the Emergency Department if new signs and symptoms develop, or if the current signs or symptoms continue to change or worsen for further workup, evaluation and treatment as clinically indicated and appropriate  The  patient will follow  up with their current PCP if and as advised. If the patient does not currently have a PCP we will assist them in obtaining one.   The patient may need specialty follow up if the symptoms continue, in spite of conservative treatment and management, for further workup, evaluation, consultation and treatment as clinically indicated and appropriate.  Patient/parent/caregiver verbalized understanding and agreement of plan as discussed.  All questions were addressed during visit.  Please see discharge instructions below for further details of plan.  This office note has been dictated using Teaching laboratory technician.  Unfortunately, this method of dictation can sometimes lead to typographical or grammatical errors.  I apologize for your inconvenience in advance if this occurs.  Please do not hesitate to reach out to me if clarification is needed.      Theadora Rama Scales, [place] 02/04/24 [phone]

## 2024-02-04 NOTE — ED Notes (Signed)
 Reviewed 2 work notes

## 2024-02-04 NOTE — ED Triage Notes (Signed)
 Started feeling bad Saturday.  Patient complains of nausea, vomiting, and diarrhea.  No vomiting today.  Reports five episodes of diarrhea today  Patient has not had medicine for symptoms

## 2024-02-13 ENCOUNTER — Ambulatory Visit
Admission: EM | Admit: 2024-02-13 | Discharge: 2024-02-13 | Disposition: A | Discharge status: Home / Self Care | Attending: Family Medicine | Admitting: Family Medicine

## 2024-02-13 DIAGNOSIS — J101 Influenza due to other identified influenza virus with other respiratory manifestations: Secondary | ICD-10-CM

## 2024-02-13 LAB — POC COVID19/FLU A&B COMBO
Covid Antigen, POC: NEGATIVE
Influenza A Antigen, POC: POSITIVE — AB
Influenza B Antigen, POC: NEGATIVE

## 2024-02-13 MED ORDER — PREDNISONE 20 MG PO TABS: ORAL_TABLET | ORAL | 0 refills | Status: DC

## 2024-02-13 MED ORDER — OSELTAMIVIR PHOSPHATE 75 MG PO CAPS: 75.0000 mg | ORAL_CAPSULE | Freq: Two times a day (BID) | ORAL | 0 refills | Status: DC

## 2024-02-13 NOTE — ED Provider Notes (Signed)
 Robert Harris    CSN: [id number] Arrival date & time: 02/13/24  1100      History   Chief Complaint Chief Complaint  Patient presents with   Generalized Body Aches   Fever    HPI Robert Harris. is a 43 y.o. male.    Two days ago patient suddenly developed cough, fever, mild sinus congestion, myalgias, fatigue, and mild sore throat.  He has developed mild shortness of breath with activity but denies pleuritic pain.  He has a history of asthma but denies wheezing and has not needed an albuterol inhaler.  The history is provided by the patient.    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   Polysubstance abuse (HCC) 11/08/2023    Patient Active Problem List   Diagnosis Date Noted   Polysubstance dependence in early, early partial, sustained full, or sustained partial remission (HCC) 12/24/2023   MDD (major depressive disorder), recurrent, in partial remission (HCC) 12/24/2023   PTSD (post-traumatic stress disorder) 11/08/2023    Past Surgical History:  Procedure Laterality Date   CLOSED REDUCTION METACARPAL WITH PERCUTANEOUS PINNING Left 02/12/2021   Procedure: open reduction and internal fixation of metacarpal left thumb;  Surgeon: Bradly Bienenstock, MD;  Location: [facility] OR;  Service: Orthopedics;  Laterality: Left;  with IV sedation   COLONOSCOPY     TONSILLECTOMY     WISDOM TOOTH EXTRACTION         Home Medications    Prior to Admission medications   Medication Sig Start Date End Date Taking? Authorizing Provider  oseltamivir (TAMIFLU) 75 MG capsule Take 1 capsule (75 mg total) by mouth every 12 (twelve) hours. 02/13/24  Yes Lattie Haw, MD  predniSONE (DELTASONE) 20 MG tablet Take one tab by mouth twice daily for 4 days, then one daily for 3 days. Take with food. 02/13/24  Yes  Lattie Haw, MD  albuterol (VENTOLIN HFA) 108 (90 Base) MCG/ACT inhaler Inhale 2 puffs into the lungs every 6 (six) hours as needed for wheezing or shortness of breath. 08/28/22   [provider]  loperamide (IMODIUM A-D) 2 MG tablet Take 1 tablet (2 mg total) by mouth 4 (four) times daily as needed for diarrhea or loose stools. 02/04/24   Theadora Rama Scales, PA-C  ondansetron (ZOFRAN-ODT) 4 MG disintegrating tablet Take 1 tablet (4 mg total) by mouth every 8 (eight) hours as needed for nausea or vomiting. 02/04/24   Theadora Rama Scales, PA-C  prazosin (MINIPRESS) 1 MG capsule Take 1-2 capsules (1-2 mg total) by mouth at bedtime. Take 1 tablet (1 mg) nightly for 1 week then increase to 2 tablets (2 mg) nightly. 12/19/23 02/17/24 Yes Elsie Lincoln, MD  QUEtiapine (SEROQUEL) 25 MG tablet Take 25 mg by mouth. 11/13/23   [provider]  sertraline (ZOLOFT) 100 MG tablet Take 1 tablet (100 mg total) by mouth daily. 12/19/23 02/17/24 Yes Elsie Lincoln, MD    Family History Family History  Problem Relation Age of Onset   Lung cancer Mother     Social History Social History   Tobacco Use   Smoking status: Every Day    Current packs/day: 0.25    Average packs/day: 0.3 packs/day for 20.0 years (5.0 ttl pk-yrs)    Types: Cigarettes   Smokeless tobacco: Former  Types: Chew   Tobacco comments:    "every now and then"  Vaping Use   Vaping status: Every Day   Substances: Nicotine, THC  Substance Use Topics   Alcohol use: Not Currently    Alcohol/week: 6.0 standard drinks of alcohol    Types: 6 Cans of beer per week    Comment: few times weekly   Drug use: Not Currently    Types: Marijuana, Cocaine, Methamphetamines     Allergies   Patient has no known allergies.   Review of Systems Review of Systems+ sore throat + cough No pleuritic pain No wheezing + nasal congestion + post-nasal drainage No sinus pain/pressure No itchy/red eyes No earache No  hemoptysis + mild SOB + fever  No nausea No vomiting No abdominal pain No diarrhea No urinary symptoms No skin rash + fatigue + myalgias No headache Used OTC meds (ibuprofen, Mucinex) without relief    Physical Exam Triage Vital Signs ED Triage Vitals  Encounter Vitals Group     BP 02/13/24 1105 102/67     Systolic BP Percentile --      Diastolic BP Percentile --      Pulse Rate 02/13/24 1105 88     Resp 02/13/24 1105 15     Temp 02/13/24 1105 99.4 F (37.4 C)     Temp Source 02/13/24 1105 Oral     SpO2 02/13/24 1105 94 %     Weight --      Height --      Head Circumference --      Peak Flow --      Pain Score 02/13/24 1125 4     Pain Loc --      Pain Education --      Exclude from Growth Chart --    No data found.  Updated Vital Signs BP 99/65 (BP Location: Right Arm)   Pulse 75   Temp 98.7 F (37.1 C)   Resp 16   SpO2 94%   Visual Acuity Right Eye Distance:   Left Eye Distance:   Bilateral Distance:    Right Eye Near:   Left Eye Near:    Bilateral Near:     Physical Exam Nursing notes and Vital Signs reviewed. Appearance:  Patient appears stated age, and in no acute distress Eyes:  Pupils are equal, round, and reactive to light and accomodation.  Extraocular movement is intact.  Conjunctivae are not inflamed  Ears:  Canals normal.  Tympanic membranes normal.  Nose:  Mildly congested turbinates.  No sinus tenderness.   Pharynx:  Normal Neck:  Supple.  Mildly enlarged lateral nodes are present, tender to palpation on the left.   Lungs:  Clear to auscultation.  Breath sounds are equal.  Moving air well. Heart:  Regular rate and rhythm without murmurs, rubs, or gallops.  Abdomen:  Nontender without masses or hepatosplenomegaly.  Bowel sounds are present.  No CVA or flank tenderness.  Extremities:  No edema.  Skin:  No rash present.   Harris Treatments / Results  Labs (all labs ordered are listed, but only abnormal results are displayed) Labs Reviewed   POC COVID19/FLU A&B COMBO - Abnormal; Notable for the following components:      Result Value   Influenza A Antigen, POC Positive (*)    All other components within normal limits    EKG   Radiology No results found.  Procedures Procedures (including critical care time)  Medications Ordered in Harris Medications - No data  to display  Initial Impression / Assessment and Plan / Harris Course  I have reviewed the triage vital signs and the nursing notes.  Pertinent labs & imaging results that were available during my care of the patient were reviewed by me and considered in my medical decision making (see chart for details).    Begin Tamiflu.  Because of history of asthma will begin prednisone burst/taper. Followup with Family Doctor if not improved in one week.   Final Clinical Impressions(s) / Harris Diagnoses   Final diagnoses:  Influenza A     Discharge Instructions      Take plain guaifenesin (1200mg  extended release tabs such as Mucinex) twice daily, with plenty of water, for cough and congestion.  May add Pseudoephedrine (30mg , one or two every 4 to 6 hours) for sinus congestion.  Get adequate rest.   May use Afrin nasal spray (or generic oxymetazoline) each morning for about 5 days and then discontinue.  Also recommend using saline nasal spray several times daily and saline nasal irrigation (AYR is a common brand).  Use Flonase nasal spray each morning after using Afrin nasal spray and saline nasal irrigation. Try warm salt water gargles for sore throat.  May take Delsym Cough Suppressant ("12 Hour Cough Relief") at bedtime for nighttime cough.  Stop all antihistamines for now, and other non-prescription cough/cold preparations. May take Tylenol as needed for fever, body aches, etc.   If symptoms become significantly worse during the night or over the weekend, proceed to the local emergency room.      ED Prescriptions     Medication Sig Dispense Auth. Provider    oseltamivir (TAMIFLU) 75 MG capsule Take 1 capsule (75 mg total) by mouth every 12 (twelve) hours. 10 capsule Lattie Haw, MD   predniSONE (DELTASONE) 20 MG tablet Take one tab by mouth twice daily for 4 days, then one daily for 3 days. Take with food. 11 tablet Lattie Haw, MD         Lattie Haw, MD 02/13/24 [phone]

## 2024-02-13 NOTE — ED Triage Notes (Addendum)
 Pt c/o fever, chest congestion, loss of appetite, body aches, and a cough.   Start Date: 02/11/2024  Home interventions: Ibuprofen, Mucinex   Last Dose of Ibuprofen: In the last hour, took around 200 mg

## 2024-02-13 NOTE — Discharge Instructions (Signed)
 Take plain guaifenesin (1200mg  extended release tabs such as Mucinex) twice daily, with plenty of water, for cough and congestion.  May add Pseudoephedrine (30mg , one or two every 4 to 6 hours) for sinus congestion.  Get adequate rest.   May use Afrin nasal spray (or generic oxymetazoline) each morning for about 5 days and then discontinue.  Also recommend using saline nasal spray several times daily and saline nasal irrigation (AYR is a common brand).  Use Flonase nasal spray each morning after using Afrin nasal spray and saline nasal irrigation. Try warm salt water gargles for sore throat.  May take Delsym Cough Suppressant ("12 Hour Cough Relief") at bedtime for nighttime cough.  Stop all antihistamines for now, and other non-prescription cough/cold preparations. May take Tylenol as needed for fever, body aches, etc.   If symptoms become significantly worse during the night or over the weekend, proceed to the local emergency room.

## 2024-02-27 DIAGNOSIS — F4310 Post-traumatic stress disorder, unspecified: Secondary | ICD-10-CM | POA: Diagnosis not present

## 2024-03-12 DIAGNOSIS — F4310 Post-traumatic stress disorder, unspecified: Secondary | ICD-10-CM | POA: Diagnosis not present

## 2024-03-18 DIAGNOSIS — F4310 Post-traumatic stress disorder, unspecified: Secondary | ICD-10-CM | POA: Diagnosis not present

## 2024-03-20 ENCOUNTER — Telehealth (HOSPITAL_COMMUNITY): Payer: Self-pay

## 2024-03-20 NOTE — Telephone Encounter (Signed)
 This patient was seen by Dr. Alfonse Flavors in January 2025. He is in need of a med refill. The patient does not have an upcoming appointment as he lives in [place]. I did reach out to [place] office to get an appointment for the patient. Is there anyway a refill could be sent in for him. Please advise. Thanks!

## 2024-03-20 NOTE — Telephone Encounter (Addendum)
 This patient was seen by Dr. Alfonse Flavors in January 2025. He is in need of a med refill. The patient does not have an upcoming appointment as he lives in [place]. I did reach out to [place] office to get an appointment for the patient. Is there anyway a refill could be sent in for him. Please advise. Thanks!   Screened by nursing staff*   Sending over to provider for final judgment.  But usually Pts need to seen within 3 moths or has to be re-established to clinic, presuming.   JNL

## 2024-03-21 ENCOUNTER — Telehealth (HOSPITAL_COMMUNITY): Payer: Self-pay

## 2024-03-21 ENCOUNTER — Other Ambulatory Visit (HOSPITAL_COMMUNITY): Payer: Self-pay | Admitting: Student

## 2024-03-21 DIAGNOSIS — F4310 Post-traumatic stress disorder, unspecified: Secondary | ICD-10-CM

## 2024-03-21 MED ORDER — SERTRALINE HCL 100 MG PO TABS: 100.0000 mg | ORAL_TABLET | Freq: Every day | ORAL | 0 refills | Status: DC

## 2024-03-21 MED ORDER — PRAZOSIN HCL 1 MG PO CAPS: 2.0000 mg | ORAL_CAPSULE | Freq: Every day | ORAL | 0 refills | Status: DC

## 2024-03-21 NOTE — Telephone Encounter (Signed)
"  Good morning, patient is a [place] resident, was seen here in January 2025 with Dr. Alfonse Flavors. I am requesting an appointment for [facility] with Dr. Gilmore Laroche but Dr. Gilmore Laroche has not responded yet. Is there anyway that we could get him a refill for atleast 30 days to give him a chance to find a new provider? Please advise. Thanks! "  - Psychologist, sport and exercise   Screened by nursing staff,   forwarding this to provider as there is not anything in this particular scenario that I can address, but if need be please let me know how I can assist Dr. Alfonse Flavors.    -JNL, CMA

## 2024-03-21 NOTE — Telephone Encounter (Signed)
 Good morning, patient is a [place] resident, was seen here in January 2025 with Dr. Alfonse Flavors. I am requesting an appointment for [facility] with Dr. Gilmore Laroche but Dr. Gilmore Laroche has not responded yet. Is there anyway that we could get him a refill for atleast 30 days to give him a chance to find a new provider? Please advise. Thanks!

## 2024-03-21 NOTE — Telephone Encounter (Signed)
 30 day supply sent to preferred pharmacy.  Dr. Alfonse Flavors

## 2024-03-26 ENCOUNTER — Encounter (HOSPITAL_COMMUNITY): Payer: Self-pay | Admitting: Registered Nurse

## 2024-03-26 ENCOUNTER — Ambulatory Visit (HOSPITAL_COMMUNITY): Admitting: Registered Nurse

## 2024-03-26 DIAGNOSIS — F4310 Post-traumatic stress disorder, unspecified: Secondary | ICD-10-CM

## 2024-03-26 DIAGNOSIS — F1921 Other psychoactive substance dependence, in remission: Secondary | ICD-10-CM

## 2024-03-26 DIAGNOSIS — F3341 Major depressive disorder, recurrent, in partial remission: Secondary | ICD-10-CM

## 2024-03-26 MED ORDER — SERTRALINE HCL 100 MG PO TABS
100.0000 mg | ORAL_TABLET | Freq: Every day | ORAL | 1 refills | Status: DC
Start: 2024-03-26 — End: 2024-03-26

## 2024-03-26 MED ORDER — SERTRALINE HCL 100 MG PO TABS: 100.0000 mg | ORAL_TABLET | Freq: Every day | ORAL | 1 refills | Status: AC

## 2024-03-26 MED ORDER — SERTRALINE HCL 100 MG PO TABS: 100.0000 mg | ORAL_TABLET | Freq: Every day | ORAL | 1 refills | Status: DC
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
## 2024-04-02 DIAGNOSIS — F4310 Post-traumatic stress disorder, unspecified: Secondary | ICD-10-CM | POA: Diagnosis not present

## 2024-07-21 ENCOUNTER — Ambulatory Visit
Admission: EM | Admit: 2024-07-21 | Discharge: 2024-07-21 | Disposition: A | Discharge status: Home / Self Care | Attending: Emergency Medicine | Admitting: Emergency Medicine

## 2024-07-21 DIAGNOSIS — J069 Acute upper respiratory infection, unspecified: Secondary | ICD-10-CM | POA: Diagnosis not present

## 2024-07-21 DIAGNOSIS — H66003 Acute suppurative otitis media without spontaneous rupture of ear drum, bilateral: Secondary | ICD-10-CM | POA: Diagnosis not present

## 2024-07-21 DIAGNOSIS — R062 Wheezing: Secondary | ICD-10-CM

## 2024-07-21 DIAGNOSIS — H6123 Impacted cerumen, bilateral: Secondary | ICD-10-CM | POA: Diagnosis not present

## 2024-07-21 DIAGNOSIS — R6889 Other general symptoms and signs: Secondary | ICD-10-CM

## 2024-07-21 LAB — POC COVID19/FLU A&B COMBO
Covid Antigen, POC: NEGATIVE
Influenza A Antigen, POC: NEGATIVE
Influenza B Antigen, POC: NEGATIVE

## 2024-07-21 MED ORDER — AMOXICILLIN-POT CLAVULANATE 875-125 MG PO TABS: 1.0000 | ORAL_TABLET | Freq: Two times a day (BID) | ORAL | 0 refills | Status: AC

## 2024-07-21 MED ORDER — ALBUTEROL SULFATE HFA 108 (90 BASE) MCG/ACT IN AERS: 2.0000 | INHALATION_SPRAY | Freq: Four times a day (QID) | RESPIRATORY_TRACT | 2 refills | Status: AC | PRN

## 2024-07-21 NOTE — ED Provider Notes (Signed)
 [facility]    CSN: [id number] Arrival date & time: 07/21/24  1236    HISTORY   Chief Complaint  Patient presents with   Cough   Generalized Body Aches   HPI Robert Harris. is a pleasant, 43 y.o. male who presents to urgent care today. Patient complains of a 24 to 36-hour summer cold causing him to have a cough intermittently productive of sputum, head congestion, loss of appetite, subjective fever, night sweats, feeling of fullness in his ears and decreased hearing, fatigue, diarrhea, body aches, loss of sensation of taste.  Patient denies known sick contacts.  States he has been taking Tylenol , Mucinex and a high-dose vitamin C supplement without meaningful relief.  Patient denies known history of allergies or asthma, states he has been prescribed albuterol  in the past for previous lower respiratory tract infection.  States he has not tried using it since his symptoms began.  The history is provided by the patient.  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   Polysubstance abuse (HCC) 11/08/2023   Polysubstance abuse (HCC)    PTSD (post-traumatic stress disorder)    Patient Active Problem List   Diagnosis Date Noted   Polysubstance dependence in early, early partial, sustained full, or sustained partial remission (HCC) 12/24/2023   MDD (major depressive disorder), recurrent, in partial remission (HCC) 12/24/2023   PTSD (post-traumatic stress disorder) 11/08/2023   Past Surgical History:  Procedure Laterality Date   CLOSED REDUCTION METACARPAL WITH PERCUTANEOUS PINNING Left 02/12/2021   Procedure: open reduction and internal fixation of metacarpal left thumb;  Surgeon: Shari Easter, MD;  Location: [facility] OR;  Service: Orthopedics;  Laterality: Left;  with IV sedation   COLONOSCOPY      TONSILLECTOMY     WISDOM TOOTH EXTRACTION      Home Medications    Prior to Admission medications   Medication Sig Start Date End Date Taking? Authorizing Provider  albuterol  (VENTOLIN  HFA) 108 (90 Base) MCG/ACT inhaler Inhale 2 puffs into the lungs every 6 (six) hours as needed for wheezing or shortness of breath. 08/28/22   [provider]  loperamide  (IMODIUM  A-D) 2 MG tablet Take 1 tablet (2 mg total) by mouth 4 (four) times daily as needed for diarrhea or loose stools. 02/04/24   Joesph Shaver Scales, PA-C  ondansetron  (ZOFRAN -ODT) 4 MG disintegrating tablet Take 1 tablet (4 mg total) by mouth every 8 (eight) hours as needed for nausea or vomiting. 02/04/24   Joesph Shaver Scales, PA-C  sertraline  (ZOLOFT ) 100 MG tablet Take 1 tablet (100 mg total) by mouth daily. 03/26/24   Rankin, Luisa NOVAK, NP    Family History Family History  Problem Relation Age of Onset   Lung cancer Mother    Drug abuse Mother    Alcohol abuse Father    Drug abuse Brother    Social History Social History   Tobacco Use   Smoking status: Every Day    Current packs/day: 0.25    Average packs/day: 0.3 packs/day for 20.0 years (5.0 ttl pk-yrs)    Types: Cigarettes, E-cigarettes    Passive exposure: Current (Former E-Cigarettes)   Smokeless tobacco: Former    Types: Chew   Tobacco comments:    every now and then  Vaping Use   Vaping status: Former   Substances:  Nicotine, THC  Substance Use Topics   Alcohol use: Yes    Alcohol/week: 6.0 standard drinks of alcohol    Types: 6 Cans of beer per week    Comment: few times weekly   Drug use: Not Currently    Types: Marijuana, Cocaine, Methamphetamines   Allergies   Patient has no known allergies.  Review of Systems Review of Systems  Respiratory:  Positive for cough.    Pertinent findings revealed after performing a 14 point review of systems has been noted in the history of present illness.  Physical Exam Vital Signs BP 115/74  (BP Location: Right Arm)   Pulse 80   Temp 98 F (36.7 C) (Oral)   Resp 18   SpO2 96%   No data found.  Physical Exam Vitals and nursing note reviewed.  Constitutional:      General: He is awake. He is not in acute distress.    Appearance: Normal appearance. He is well-developed and well-groomed.  HENT:     Head: Normocephalic and atraumatic.     Salivary Glands: Right salivary gland is not diffusely enlarged or tender. Left salivary gland is not diffusely enlarged or tender.     Right Ear: Hearing and external ear normal. There is impacted cerumen.     Left Ear: Hearing and external ear normal. There is impacted cerumen.     Nose: Congestion and rhinorrhea present. Rhinorrhea is clear.     Right Turbinates: Not enlarged, swollen or pale.     Left Turbinates: Not enlarged, swollen or pale.     Right Sinus: No maxillary sinus tenderness or frontal sinus tenderness.     Left Sinus: No maxillary sinus tenderness or frontal sinus tenderness.     Mouth/Throat:     Lips: Pink.     Mouth: Mucous membranes are moist.     Pharynx: Oropharynx is clear. Uvula midline. Posterior oropharyngeal erythema present. No pharyngeal swelling, oropharyngeal exudate or postnasal drip.     Tonsils: No tonsillar exudate or tonsillar abscesses. 0 on the right. 0 on the left.  Eyes:     General: Lids are normal.     Extraocular Movements: Extraocular movements intact.     Conjunctiva/sclera: Conjunctivae normal.  Cardiovascular:     Rate and Rhythm: Normal rate and regular rhythm.     Heart sounds: Normal heart sounds.  Pulmonary:     Effort: Pulmonary effort is normal.     Breath sounds: No stridor, decreased air movement or transmitted upper airway sounds. Examination of the right-lower field reveals wheezing. Examination of the left-lower field reveals wheezing. Wheezing present. No decreased breath sounds, rhonchi or rales.  Musculoskeletal:     Cervical back: Full passive range of motion without  pain, normal range of motion and neck supple.  Lymphadenopathy:     Cervical: Cervical adenopathy present.     Right cervical: Superficial cervical adenopathy and posterior cervical adenopathy present.     Left cervical: Superficial cervical adenopathy and posterior cervical adenopathy present.  Neurological:     Mental Status: He is alert.  Psychiatric:        Behavior: Behavior is cooperative.     Visual Acuity Right Eye Distance:   Left Eye Distance:   Bilateral Distance:    Right Eye Near:   Left Eye Near:    Bilateral Near:     [facility] Couse / Diagnostics / Procedures:     Radiology No results found.  Procedures Ear Cerumen Removal  Date/Time: 07/21/2024  1:46 PM  Performed by: Joesph Shaver Scales, PA-C Authorized by: Joesph Shaver Scales, PA-C   Consent:    Consent obtained:  Verbal   Consent given by:  Patient   Risks, benefits, and alternatives were discussed: yes     Risks discussed:  Bleeding, infection, pain, TM perforation, incomplete removal and dizziness   Alternatives discussed:  No treatment, delayed treatment, alternative treatment, observation and referral Universal protocol:    Procedure explained and questions answered to patient or proxy's satisfaction: yes     Patient identity confirmed:  Verbally with patient and arm band Procedure details:    Location:  R ear and L ear   Procedure type: irrigation     Procedure outcomes: cerumen removed   Post-procedure details:    Inspection:  No bleeding, ear canal clear and TM intact   Hearing quality:  Normal   Procedure completion:  Tolerated  (including critical care time) EKG  Pending results:  Labs Reviewed  POC COVID19/FLU A&B COMBO    Medications Ordered in UC: Medications - No data to display  UC Diagnoses / Final Clinical Impressions(s)   I have reviewed the triage vital signs and the nursing notes.  Pertinent labs & imaging results that were available during my care of the patient  were reviewed by me and considered in my medical decision making (see chart for details).    Final diagnoses:  Feeling unwell  Excessive cerumen in both ear canals  Wheezing  Viral URI with cough  Acute suppurative otitis media of both ears without spontaneous rupture of tympanic membranes, recurrence not specified   Repeat examination of bilateral tympanic membranes after irrigation revealed suppurative fluid behind each membrane with bilateral injection of both membranes and erythema of left membrane.  Will treat patient empirically for presumed acute bacterial otitis media with a 7-day course of Augmentin .  Patient provided with renewal of albuterol  and advised to use frequently for the next several days.  Okay to continue Mucinex to promote expectoration.  Patient advised COVID-19 and influenza test was negative today.  Conservative care recommended.  Return precautions advised.  Please see discharge instructions below for details of plan of care as provided to patient. ED Prescriptions     Medication Sig Dispense Auth. Provider   albuterol  (VENTOLIN  HFA) 108 (90 Base) MCG/ACT inhaler Inhale 2 puffs into the lungs every 6 (six) hours as needed for wheezing or shortness of breath (Cough). 18 g Joesph Shaver Scales, PA-C   amoxicillin -clavulanate (AUGMENTIN ) 875-125 MG tablet Take 1 tablet by mouth 2 (two) times daily for 7 days. 14 tablet Joesph Shaver Scales, PA-C      PDMP not reviewed this encounter.  Pending results:  Labs Reviewed  POC COVID19/FLU A&B COMBO      Discharge Instructions      Your COVID-19 and influenza tests were all negative.  At this time, I believe that your symptoms are, as you stated, related to a typical summer cold.  Because you are wheezing, I do recommend that you use an albuterol  inhaler 4 times daily, please inhale 2 puffs with each use.    You are welcome to use over-the-counter cough medication such as guaifenesin (Mucinex, Robitussin) as  needed.  For treatment of your bilateral ear infections, please begin Augmentin .  Please take 1 tablet twice daily for the full 7 days for best results.  I also have enclosed some information regarding earwax buildup and how to manage this at home.  I hope you find  this helpful.  If you are not feeling better in the next 72 hours, I recommend that you take another COVID-19 test.  COVID-19 tests are still available at most pharmacies but you are welcome to come back here for repeat testing as well.  Thank you for visiting [facility] today.  We appreciate the opportunity to participate in your care.      Disposition Upon Discharge:  Condition: stable for discharge home  Patient presented with an acute illness with associated systemic symptoms and significant discomfort requiring urgent management. In my opinion, this is a condition that a prudent lay person (someone who possesses an average knowledge of health and medicine) may potentially expect to result in complications if not addressed urgently such as respiratory distress, impairment of bodily function or dysfunction of bodily organs.   Routine symptom specific, illness specific and/or disease specific instructions were discussed with the patient and/or caregiver at length.   As such, the patient has been evaluated and assessed, work-up was performed and treatment was provided in alignment with urgent care protocols and evidence based medicine.  Patient/parent/caregiver has been advised that the patient may require follow up for further testing and treatment if the symptoms continue in spite of treatment, as clinically indicated and appropriate.  Patient/parent/caregiver has been advised to return to the [facility] or PCP if no better; to PCP or the Emergency Department if new signs and symptoms develop, or if the current signs or symptoms continue to change or worsen for further workup, evaluation and treatment as clinically  indicated and appropriate  The patient will follow up with their current PCP if and as advised. If the patient does not currently have a PCP we will assist them in obtaining one.   The patient may need specialty follow up if the symptoms continue, in spite of conservative treatment and management, for further workup, evaluation, consultation and treatment as clinically indicated and appropriate.  Patient/parent/caregiver verbalized understanding and agreement of plan as discussed.  All questions were addressed during visit.  Please see discharge instructions below for further details of plan.  This office note has been dictated using Teaching laboratory technician.  Unfortunately, this method of dictation can sometimes lead to typographical or grammatical errors.  I apologize for your inconvenience in advance if this occurs.  Please do not hesitate to reach out to me if clarification is needed.      Joesph Shaver Scales, PA-C 07/21/24 1552

## 2024-07-21 NOTE — Discharge Instructions (Addendum)
 Your COVID-19 and influenza tests were all negative.  At this time, I believe that your symptoms are, as you stated, related to a typical summer cold.  Because you are wheezing, I do recommend that you use an albuterol inhaler 4 times daily, please inhale 2 puffs with each use.    You are welcome to use over-the-counter cough medication such as guaifenesin (Mucinex, Robitussin) as needed.  For treatment of your bilateral ear infections, please begin Augmentin.  Please take 1 tablet twice daily for the full 7 days for best results.  I also have enclosed some information regarding earwax buildup and how to manage this at home.  I hope you find this helpful.  If you are not feeling better in the next 72 hours, I recommend that you take another COVID-19 test.  COVID-19 tests are still available at most pharmacies but you are welcome to come back here for repeat testing as well.  Thank you for visiting [facility] today.  We appreciate the opportunity to participate in your care.

## 2024-07-21 NOTE — ED Triage Notes (Addendum)
 Pt c/o a cough, head congestion, loss of taste, fatigue, diarrhea, body aches, loss of appetite, and sensation of a fever. Pt denies any known exposures.   Started: Yesterday or Saturday night   Interventions: Tylenol  (Last night), Decongestant, Emergency C
